# Patient Record
Sex: Male | Born: 1970
Health system: Southern US, Community
[De-identification: ages and names within clinical notes are randomized; demographics above are authoritative.]

## PROBLEM LIST (undated history)

## (undated) DIAGNOSIS — C4490 Unspecified malignant neoplasm of skin, unspecified: Secondary | ICD-10-CM

## (undated) DIAGNOSIS — Z808 Family history of malignant neoplasm of other organs or systems: Secondary | ICD-10-CM

## (undated) DIAGNOSIS — R51 Headache: Secondary | ICD-10-CM

## (undated) DIAGNOSIS — C801 Malignant (primary) neoplasm, unspecified: Secondary | ICD-10-CM

## (undated) DIAGNOSIS — Z85850 Personal history of malignant neoplasm of thyroid: Secondary | ICD-10-CM

## (undated) DIAGNOSIS — C73 Malignant neoplasm of thyroid gland: Secondary | ICD-10-CM

## (undated) DIAGNOSIS — Z9889 Other specified postprocedural states: Secondary | ICD-10-CM

## (undated) DIAGNOSIS — R112 Nausea with vomiting, unspecified: Secondary | ICD-10-CM

## (undated) HISTORY — DX: Unspecified malignant neoplasm of skin, unspecified: C44.90

## (undated) HISTORY — DX: Family history of malignant neoplasm of other organs or systems: Z80.8

## (undated) HISTORY — DX: Malignant (primary) neoplasm, unspecified: C80.1

## (undated) HISTORY — DX: Personal history of malignant neoplasm of thyroid: Z85.850

## (undated) HISTORY — PX: TYMPANOSTOMY TUBE PLACEMENT: SHX32

## (undated) HISTORY — PX: APPENDECTOMY: SHX54

---

## 2009-02-21 ENCOUNTER — Ambulatory Visit: Payer: Self-pay | Admitting: Family Medicine

## 2009-02-21 DIAGNOSIS — L989 Disorder of the skin and subcutaneous tissue, unspecified: Secondary | ICD-10-CM | POA: Insufficient documentation

## 2009-02-21 DIAGNOSIS — C73 Malignant neoplasm of thyroid gland: Secondary | ICD-10-CM | POA: Insufficient documentation

## 2009-02-21 DIAGNOSIS — D179 Benign lipomatous neoplasm, unspecified: Secondary | ICD-10-CM | POA: Insufficient documentation

## 2009-02-24 ENCOUNTER — Encounter (INDEPENDENT_AMBULATORY_CARE_PROVIDER_SITE_OTHER): Payer: Self-pay | Admitting: *Deleted

## 2009-02-24 LAB — CONVERTED CEMR LAB: Free T4: 0.8 ng/dL (ref 0.6–1.6)

## 2009-04-03 ENCOUNTER — Encounter: Payer: Self-pay | Admitting: Family Medicine

## 2009-04-07 ENCOUNTER — Encounter: Admission: RE | Admit: 2009-04-07 | Discharge: 2009-04-07 | Payer: Self-pay | Admitting: Surgery

## 2010-05-14 ENCOUNTER — Ambulatory Visit: Payer: Self-pay | Admitting: Family Medicine

## 2010-05-14 DIAGNOSIS — B356 Tinea cruris: Secondary | ICD-10-CM

## 2010-05-15 LAB — CONVERTED CEMR LAB
ALT: 19 units/L (ref 0–53)
Albumin: 4.2 g/dL (ref 3.5–5.2)
Alkaline Phosphatase: 44 units/L (ref 39–117)
BUN: 17 mg/dL (ref 6–23)
Basophils Absolute: 0 10*3/uL (ref 0.0–0.1)
Basophils Relative: 0.5 % (ref 0.0–3.0)
Calcium: 9.4 mg/dL (ref 8.4–10.5)
Eosinophils Absolute: 0 10*3/uL (ref 0.0–0.7)
Glucose, Bld: 82 mg/dL (ref 70–99)
Lymphs Abs: 1.2 10*3/uL (ref 0.7–4.0)
MCV: 97.5 fL (ref 78.0–100.0)
Monocytes Absolute: 0.3 10*3/uL (ref 0.1–1.0)
Monocytes Relative: 7.2 % (ref 3.0–12.0)
Neutro Abs: 2.4 10*3/uL (ref 1.4–7.7)
Neutrophils Relative %: 60.2 % (ref 43.0–77.0)
Platelets: 155 10*3/uL (ref 150.0–400.0)
RDW: 12.2 % (ref 11.5–14.6)
Total Bilirubin: 1.3 mg/dL — ABNORMAL HIGH (ref 0.3–1.2)
Total CHOL/HDL Ratio: 3
VLDL: 4 mg/dL (ref 0.0–40.0)

## 2010-05-27 ENCOUNTER — Encounter: Admission: RE | Admit: 2010-05-27 | Discharge: 2010-05-27 | Payer: Self-pay | Admitting: Family Medicine

## 2010-05-28 ENCOUNTER — Telehealth (INDEPENDENT_AMBULATORY_CARE_PROVIDER_SITE_OTHER): Payer: Self-pay | Admitting: *Deleted

## 2011-01-05 NOTE — Assessment & Plan Note (Signed)
Summary: cpx//pt will be fasting//lch   Vital Signs:  Patient profile:   40 year old male Height:      76.25 inches Weight:      223 pounds BMI:     27.06 Pulse rate:   50 / minute BP sitting:   110 / 70  (left arm)  Vitals Entered By: Doristine Devoid (May 14, 2010 8:32 AM) CC: CPX AND LABS   History of Present Illness: 40 yo man here today for CPE.  moving back to [place] in July.  itching in the groin- sxs started  ~2 yrs ago.  intermittant.  worse w/ heat.  has tried OTC hydrocortisone which improves sxs but doesn't resolve them.  bilateral groin sxs and lower back.  no family members w/ similar.  Thyroid nodule- was due for f/u  ~9 months ago but did not go back.  Preventive Screening-Counseling & Management  Alcohol-Tobacco     Alcohol drinks/day: <1     Alcohol type: beer     Smoking Status: never  Caffeine-Diet-Exercise     Does Patient Exercise: yes     Type of exercise: running, gym for lifting      Sexual History:  currently monogamous.        Drug Use:  never.    Current Medications (verified): 1)  None  Allergies (verified): No Known Drug Allergies  Past History:  Past Medical History: Last updated: 02/21/2009 Thyroid Nodule-right  Past Surgical History: Last updated: 02/21/2009 Appendectomy  Family History: Last updated: 02/21/2009 CAD-no HTN-no DM-no STROKE-no COLON CA-no PROSTATE CA-no Skin cancer- paternal aunt  Social History: Last updated: 02/21/2009 married, originally from [place] works for [employer] 2 children no tobacco 2-3 beers/week no drugs  Review of Systems  The patient denies anorexia, fever, weight loss, weight gain, vision loss, decreased hearing, hoarseness, chest pain, syncope, dyspnea on exertion, peripheral edema, prolonged cough, headaches, abdominal pain, melena, hematochezia, severe indigestion/heartburn, hematuria, suspicious skin lesions, depression, abnormal bleeding, enlarged lymph nodes, and testicular  masses.    Physical Exam  General:  Well-developed,well-nourished,in no acute distress; alert,appropriate and cooperative throughout examination Head:  Normocephalic and atraumatic without obvious abnormalities. No apparent alopecia or balding. Eyes:  PERRL, EOMI, eye exam yesterday w/ ophtho Ears:  External ear exam shows no significant lesions or deformities.  Otoscopic examination reveals clear canals, tympanic membranes are intact bilaterally without bulging, retraction, inflammation or discharge. Hearing is grossly normal bilaterally. Nose:  External nasal examination shows no deformity or inflammation. Nasal mucosa are pink and moist without lesions or exudates. Mouth:  Oral mucosa and oropharynx without lesions or exudates.  Teeth in good repair. Neck:  No deformities, ? small thyroid nodule on R, no tenderness noted. Lungs:  Normal respiratory effort, chest expands symmetrically. Lungs are clear to auscultation, no crackles or wheezes. Heart:  Normal rate and regular rhythm. S1 and S2 normal without gallop, murmur, click, rub or other extra sounds. Abdomen:  soft, NT/ND, +BS Genitalia:  Testes bilaterally descended without nodularity, tenderness or masses. No scrotal masses or lesions. No penis lesions or urethral discharge. Msk:  No deformity or scoliosis noted of thoracic or lumbar spine.   Pulses:  + 2 carotid, radial, DP Extremities:  No clubbing, cyanosis, edema, or deformity noted with normal full range of motion of all joints.   Neurologic:  alert & oriented X3, cranial nerves II-XII intact, gait normal, and DTRs symmetrical and normal.   Skin:  tinea cruris bilaterally Cervical Nodes:  No lymphadenopathy noted  Inguinal Nodes:  No significant adenopathy Psych:  Cognition and judgment appear intact. Alert and cooperative with normal attention span and concentration. No apparent delusions, illusions, hallucinations   Impression & Recommendations:  Problem # 1:  PHYSICAL  EXAMINATION (ICD-V70.0) Assessment New pt's PE WNL.  check labs.  anticipatory guidance provided. Orders: Venipuncture (04540) TLB-Lipid Panel (80061-LIPID) TLB-BMP (Basic Metabolic Panel-BMET) (80048-METABOL) TLB-CBC Platelet - w/Differential (85025-CBCD) TLB-Hepatic/Liver Function Pnl (80076-HEPATIC) TLB-TSH (Thyroid Stimulating Hormone) (84443-TSH)  Problem # 2:  THYROID NODULE, RIGHT (ICD-241.0) Assessment: Unchanged due for f/u US, referral made. Orders: Radiology Referral (Radiology)  Problem # 3:  TINEA CRURIS (ICD-110.3) Assessment: New start Lamisil.  no signs of superimposed infxn.  Patient Instructions: 1)  We'll notify you of your lab results 2)  Someone will call you with your ultrasound appt 3)  Use Lamisil two times a day on affected areas- try and keep area clean and dry 4)  Keep an eye on the small, darker mole in the center of your lower back, right above your beltline- it looks fine now, but watch for changes 5)  Good luck with the move!

## 2011-01-05 NOTE — Progress Notes (Signed)
Summary: ultrasound  Phone Note Outgoing Call   Call placed by: Doristine Devoid,  May 28, 2010 9:46 AM Call placed to: Patient Summary of Call: pt has small new nodule and previous nodule has enlarged slightly.  please notify pt that he should have this followed up after they move.  we can print copy of report for pt to take with him  Follow-up for Phone Call        left message on machine .....Marland KitchenMarland KitchenDoristine Devoid  May 28, 2010 9:46 AM   spoke w/ patient aware of ultrasound report and mailed copy of patient ........Marland KitchenDoristine Devoid  May 28, 2010 11:04 AM

## 2011-08-06 ENCOUNTER — Telehealth: Payer: Self-pay | Admitting: Family Medicine

## 2011-08-06 NOTE — Telephone Encounter (Signed)
Left detailed msg for pt and wife no immunizations on file.

## 2011-10-15 ENCOUNTER — Telehealth: Payer: Self-pay | Admitting: Family Medicine

## 2011-10-15 NOTE — Telephone Encounter (Signed)
Pt was referred on 02-21-09 to Dr Gerrit Friends at [facility] for evaluation and treatment. Pt was last seen on 05-14-10 for CPX and advised that he was moving back to [place] in July.. Please advise

## 2011-10-15 NOTE — Telephone Encounter (Signed)
Pt aware Dr Gerrit Friends [phone] info given. Pt indicated that he is in the process of apply for green cards so he has to see a special physician so that is why he has not been in.

## 2011-10-15 NOTE — Telephone Encounter (Signed)
If he has seen Dr Gerrit Friends previously he can call and schedule an appt (please give him the #).  If he is back in town he will need to schedule an appt.

## 2011-10-15 NOTE — Telephone Encounter (Signed)
Noted. Thanks.

## 2011-12-08 ENCOUNTER — Other Ambulatory Visit (INDEPENDENT_AMBULATORY_CARE_PROVIDER_SITE_OTHER): Payer: Self-pay | Admitting: Surgery

## 2011-12-08 DIAGNOSIS — E041 Nontoxic single thyroid nodule: Secondary | ICD-10-CM

## 2011-12-08 DIAGNOSIS — E049 Nontoxic goiter, unspecified: Secondary | ICD-10-CM

## 2011-12-10 ENCOUNTER — Ambulatory Visit
Admission: RE | Admit: 2011-12-10 | Discharge: 2011-12-10 | Disposition: A | Discharge status: Home / Self Care | Payer: BC Managed Care – PPO | Source: Ambulatory Visit | Attending: Surgery | Admitting: Surgery

## 2011-12-10 ENCOUNTER — Other Ambulatory Visit (INDEPENDENT_AMBULATORY_CARE_PROVIDER_SITE_OTHER): Payer: Self-pay | Admitting: Surgery

## 2011-12-10 DIAGNOSIS — E041 Nontoxic single thyroid nodule: Secondary | ICD-10-CM

## 2011-12-11 LAB — TSH: TSH: 1.631 u[IU]/mL (ref 0.350–4.500)

## 2011-12-14 ENCOUNTER — Ambulatory Visit (INDEPENDENT_AMBULATORY_CARE_PROVIDER_SITE_OTHER): Payer: BC Managed Care – PPO | Admitting: Surgery

## 2011-12-14 ENCOUNTER — Encounter (INDEPENDENT_AMBULATORY_CARE_PROVIDER_SITE_OTHER): Payer: Self-pay | Admitting: Surgery

## 2011-12-14 VITALS — BP 118/84 | HR 56 | Temp 99.0°F | Resp 16 | Ht 76.0 in | Wt 227.8 lb

## 2011-12-14 DIAGNOSIS — E041 Nontoxic single thyroid nodule: Secondary | ICD-10-CM

## 2011-12-14 NOTE — Progress Notes (Signed)
Visit Diagnoses: 1. THYROID NODULE, RIGHT    HISTORY: The patient is a 41 year old white male with known history of right thyroid nodule. Patient has undergone sequential ultrasound scanning since 2010. Nodule has remained stable. Thyroid function tests have been normal.  At my request he had a followup thyroid ultrasound performed last week. This shows the right lobe measuring 5.2 cm and left lobe measuring 4.6 cm. There is a tiny 5 mm hypoechoic nodule in the superior pole of the right lobe. This is stable. There is a dominant 1.5 cm nodule which appears well circumscribed and has no evidence of calcification in the mid right lobe. Left lobe is normal.  Laboratory studies show a normal TSH level of 1.631. Patient has never been on thyroid medication.  PERTINENT REVIEW OF SYSTEMS: Denies tremor. Denies palpitations. Denies palpable mass. Denies dysphagia. Denies dyspnea.  EXAM: HEENT: normocephalic; pupils equal and reactive; sclerae clear; dentition good; mucous membranes moist NECK:  Palpable nodule near right thyroid lobe, 1.5 cm, nontender, soft, mobile; symmetric on extension; no palpable anterior or posterior cervical lymphadenopathy; no supraclavicular masses; no tenderness CHEST: clear to auscultation bilaterally without rales, rhonchi, or wheezes CARDIAC: regular rate and rhythm without significant murmur; peripheral pulses are full EXT:  non-tender without edema; no deformity NEURO: no gross focal deficits; no sign of tremor   IMPRESSION: Right thyroid nodule, 1.5 cm, clinically stable  PLAN: The patient and I reviewed all of the above results. Clinically the right thyroid nodule is stable. There are no worrisome findings. I believe this is a low risk lesion. We will plan to repeat a thyroid ultrasound in 2 years. We will check a TSH level at that time. He will return for physical examination following the ultrasound examination.  Velora Heckler, MD, FACS General & Endocrine  [facility]

## 2012-01-12 ENCOUNTER — Ambulatory Visit (INDEPENDENT_AMBULATORY_CARE_PROVIDER_SITE_OTHER): Payer: BC Managed Care – PPO | Admitting: Family Medicine

## 2012-01-12 ENCOUNTER — Encounter: Payer: Self-pay | Admitting: Family Medicine

## 2012-01-12 VITALS — BP 115/80 | HR 58 | Temp 98.0°F | Ht 76.0 in | Wt 225.5 lb

## 2012-01-12 DIAGNOSIS — J3081 Allergic rhinitis due to animal (cat) (dog) hair and dander: Secondary | ICD-10-CM

## 2012-01-12 NOTE — Progress Notes (Signed)
  Subjective:    Patient ID: Jose Mills, male    DOB: 1971/05/28, 41 y.o.   MRN: [id number]  HPI Recently returned from [place].  ? Allergies- hx of cat allergy, wants to be sure he is not allergic to dogs b/c family is considering getting dog.  Grew up w/ dogs and did not have problem but feels allergies are progressing.   Review of Systems For ROS see HPI     Objective:   Physical Exam  Vitals reviewed. Constitutional: He appears well-developed and well-nourished. No distress.  Skin: Skin is warm and dry.          Assessment & Plan:

## 2012-01-12 NOTE — Patient Instructions (Signed)
We will notify you of your lab results and whether you are allergic to dogs Call with any questions or concerns Welcome back!!

## 2012-01-12 NOTE — Assessment & Plan Note (Signed)
Pt is allergic to cats.  Will order blood work to test for possible dog allergy.  Pt expressed understanding and is in agreement w/ plan.

## 2012-01-14 LAB — ~~LOC~~ ALLERGY PANEL
Allergen, Cedar tree, t12: <0.1 kU/L (ref <0.35)
Allergen, Comm Silver Birch, t9: <0.1 kU/L (ref <0.35)
Alternaria Alternata: <0.1 kU/L (ref <0.35)
Aspergillus fumigatus, IgG: <0.1 kU/L (ref <0.35)
Bahia Grass: <0.1 kU/L (ref <0.35)
Bermuda Grass: <0.1 kU/L (ref <0.35)
Elm IgE: <0.1 kU/L (ref <0.35)
Johnson Grass: <0.1 kU/L (ref <0.35)
Mugwort: <0.1 kU/L (ref <0.35)
Nettle: <0.1 kU/L (ref <0.35)
Timothy Grass: <0.1 kU/L (ref <0.35)

## 2012-05-19 ENCOUNTER — Encounter: Payer: Self-pay | Admitting: Family Medicine

## 2012-05-19 ENCOUNTER — Ambulatory Visit (INDEPENDENT_AMBULATORY_CARE_PROVIDER_SITE_OTHER): Payer: BC Managed Care – PPO | Admitting: Family Medicine

## 2012-05-19 VITALS — BP 120/82 | HR 62 | Temp 98.2°F | Resp 16 | Ht 76.0 in | Wt 217.6 lb

## 2012-05-19 DIAGNOSIS — L0291 Cutaneous abscess, unspecified: Secondary | ICD-10-CM

## 2012-05-19 DIAGNOSIS — L0390 Cellulitis, unspecified: Secondary | ICD-10-CM | POA: Insufficient documentation

## 2012-05-19 DIAGNOSIS — L255 Unspecified contact dermatitis due to plants, except food: Secondary | ICD-10-CM

## 2012-05-19 MED ORDER — PREDNISONE 20 MG PO TABS: ORAL_TABLET | ORAL | Status: DC

## 2012-05-19 MED ORDER — CEPHALEXIN 500 MG PO CAPS: 500.0000 mg | ORAL_CAPSULE | Freq: Two times a day (BID) | ORAL | Status: AC

## 2012-05-19 NOTE — Progress Notes (Signed)
  Subjective:    Patient ID: Jose Mills, male    DOB: [birth year], 41 y.o.   MRN: [id number]  HPI Rash- sxs started 7-8 days ago, thought it was a mosquito bite.  Areas continued to spread.  Linear on ankles, L calf, bilateral forearms, L lower thigh.  + itchy, oozing.  Now warm and red.  No fevers.   Review of Systems For ROS see HPI     Objective:   Physical Exam  Vitals reviewed. Constitutional: He appears well-developed and well-nourished. No distress.  Skin: Skin is warm. Rash (linear, vesicular rash of both ankles, L calf, L lower thigh, forearms bilaterally.  ankle lesions w/ yellow crusting and drainage w/ surrounding erythema and warmth) noted. There is erythema.          Assessment & Plan:

## 2012-05-19 NOTE — Patient Instructions (Addendum)
This is infected poison ivy Start the Keflex twice daily for the infection If the redness continues to spread or the drainage worsens- call us! Pick up the prednisone if you need it for the itching Call with any questions or concerns Have a good weekend!

## 2012-05-23 NOTE — Assessment & Plan Note (Signed)
New.  Pt w/ poison ivy and now w/ infxn.  Start prednisone for itching and keflex for infxn.  Pt expressed understanding and is in agreement w/ plan.

## 2012-05-23 NOTE — Assessment & Plan Note (Signed)
New.  Pt w/ likely superimposed impetigo infxn at site of original poison ivy.  Start keflex.  Pt expressed understanding and is in agreement w/ plan.

## 2013-10-09 ENCOUNTER — Encounter (INDEPENDENT_AMBULATORY_CARE_PROVIDER_SITE_OTHER): Payer: Self-pay | Admitting: Surgery

## 2013-12-06 DIAGNOSIS — C73 Malignant neoplasm of thyroid gland: Secondary | ICD-10-CM

## 2013-12-06 HISTORY — DX: Malignant neoplasm of thyroid gland: C73

## 2013-12-17 ENCOUNTER — Telehealth (INDEPENDENT_AMBULATORY_CARE_PROVIDER_SITE_OTHER): Payer: Self-pay

## 2013-12-17 DIAGNOSIS — E042 Nontoxic multinodular goiter: Secondary | ICD-10-CM

## 2013-12-17 NOTE — Telephone Encounter (Signed)
Pt is scheduled for his thyroid ultrasound on 12/21/13 with an arrival time of 1:30pm at [id number].  Please make pt aware when he calls back.

## 2013-12-17 NOTE — Telephone Encounter (Signed)
Pt has appt for ltf 12-28-13. Pt needs thyroid ultrasound and TSH prior to appt. Orders are in epic. LMOM for pt to call. Lab slip mailed to pt address and pt can call [phone] to set up thyroid ultrasound.

## 2013-12-19 NOTE — Telephone Encounter (Signed)
I called pt again to inform him of his appt, he did state that he could not make the appt this Friday.  I provided him with GI phone number and he will call to reschedule.  I made him aware to have US done prior to his appt next Friday with Dr. Harlow Asa.  Also informed him to have his labs drawn prior to his appt on Friday.  He is agreeable with all information given at this time.

## 2013-12-21 ENCOUNTER — Other Ambulatory Visit: Payer: BC Managed Care – PPO

## 2013-12-24 ENCOUNTER — Ambulatory Visit
Admission: RE | Admit: 2013-12-24 | Discharge: 2013-12-24 | Disposition: A | Discharge status: Home / Self Care | Payer: BC Managed Care – PPO | Source: Ambulatory Visit | Attending: Surgery | Admitting: Surgery

## 2013-12-24 DIAGNOSIS — E042 Nontoxic multinodular goiter: Secondary | ICD-10-CM

## 2013-12-24 LAB — TSH: TSH: 2.089 u[IU]/mL (ref 0.350–4.500)

## 2013-12-28 ENCOUNTER — Ambulatory Visit (INDEPENDENT_AMBULATORY_CARE_PROVIDER_SITE_OTHER): Payer: BC Managed Care – PPO | Admitting: Surgery

## 2013-12-28 ENCOUNTER — Encounter (INDEPENDENT_AMBULATORY_CARE_PROVIDER_SITE_OTHER): Payer: Self-pay | Admitting: Surgery

## 2013-12-28 VITALS — BP 122/80 | HR 70 | Temp 98.0°F | Resp 18 | Ht 76.0 in | Wt 231.6 lb

## 2013-12-28 DIAGNOSIS — E041 Nontoxic single thyroid nodule: Secondary | ICD-10-CM

## 2013-12-28 NOTE — Progress Notes (Signed)
General Surgery [facility]  Chief Complaint  Patient presents with  . Follow-up    thyroid nodules    HISTORY: Patient is a 43 year old male followed for thyroid nodule. It has been 2 years since his last evaluation. At my request she underwent a thyroid ultrasound on 12/24/2013. This shows a normal sized thyroid gland. The dominant nodule in the right lobe at the inferior pole has increased in size in the interval to 17 x 10 x 14 mm. No other worrisome features are identified. Left lobe shows no nodules. There is no lymphadenopathy.  TSH level remains normal at 2.089. The patient has never been on thyroid medication.  PERTINENT REVIEW OF SYSTEMS: Denies tremor. Denies palpitations. Denies discomfort. Denies compressive symptoms. Denies new masses.  EXAM: HEENT: normocephalic; pupils equal and reactive; sclerae clear; dentition good; mucous membranes moist NECK:  Palpable smooth firm nodule inferior right thyroid lobe, approximately 1.5 cm in diameter; left thyroid lobe without palpable abnormality; symmetric on extension; no palpable anterior or posterior cervical lymphadenopathy; no supraclavicular masses; no tenderness CHEST: clear to auscultation bilaterally without rales, rhonchi, or wheezes CARDIAC: regular rate and rhythm without significant murmur; peripheral pulses are full EXT:  non-tender without edema; no deformity NEURO: no gross focal deficits; no sign of tremor   IMPRESSION: Right thyroid nodule, 1.7 cm, with interval increase in size  PLAN: The patient and I reviewed his ultrasound report and his laboratory studies in detail. I have recommended ultrasound-guided fine-needle aspiration biopsy of the dominant right thyroid nodule. We discussed potential results from his aspiration and cytopathology. He understands and agrees to proceed.  I will contact the patient with his cytopathology results when they are available.  Earnstine Regal, MD,  [facility] Office: [phone]  Visit Diagnoses: 1. THYROID NODULE, RIGHT

## 2013-12-28 NOTE — Patient Instructions (Signed)
Thyroid Biopsy °The thyroid gland is a butterfly-shaped gland situated in the front of the neck. It produces hormones which affect metabolism, growth and development, and body temperature. A thyroid biopsy is a procedure in which small samples of tissue or fluid are removed from the thyroid gland or mass and examined under a microscope. This test is done to determine the cause of thyroid problems, such as infection, cancer, or other thyroid problems. °There are 2 ways to obtain samples: °1. Fine needle biopsy. Samples are removed using a thin needle inserted through the skin and into the thyroid gland or mass. °2. Open biopsy. Samples are removed after a cut (incision) is made through the skin. °LET YOUR CAREGIVER KNOW ABOUT:  °· Allergies. °· Medications taken including herbs, eye drops, over-the-counter medications, and creams. °· Use of steroids (by mouth or creams). °· Previous problems with anesthetics or numbing medicine. °· Possibility of pregnancy, if this applies. °· History of blood clots (thrombophlebitis). °· History of bleeding or blood problems. °· Previous surgery. °· Other health problems. °RISKS AND COMPLICATIONS °· Bleeding from the site. The risk of bleeding is higher if you have a bleeding disorder or are taking any blood thinning medications (anticoagulants). °· Infection. °· Injury to structures near the thyroid gland. °BEFORE THE PROCEDURE  °This is a procedure that can be done as an outpatient. Confirm the time that you need to arrive for your procedure. Confirm whether there is a need to fast or withhold any medications. A blood sample may be done to determine your blood clotting time. Medicine may be given to help you relax (sedative). °PROCEDURE °Fine needle biopsy. °You will be awake during the procedure. You may be asked to lie on your back with your head tipped backward to extend your neck. Let your caregiver know if you cannot tolerate the positioning. An area on your neck will be  cleansed. A needle is inserted through the skin of your neck. You may feel a mild discomfort during this procedure. You may be asked to avoid coughing, talking, swallowing, or making sounds during some portions of the procedure. The needle is withdrawn once tissue or fluid samples have been removed. Pressure may be applied to the neck to reduce swelling and ensure that bleeding has stopped. The samples will be sent for examination.  °Open biopsy. °You will be given general anesthesia. You will be asleep during the procedure. An incision is made in your neck. A sample of thyroid tissue or the mass is removed. The tissue sample or mass will be sent for examination. The sample or mass may be examined during the biopsy. If the sample or mass contains cancer cells, some or all of the thyroid gland may be removed. The incision is closed with stitches. °AFTER THE PROCEDURE  °Your recovery will be assessed and monitored. If there are no problems, as an outpatient, you should be able to go home shortly after the procedure. °If you had a fine needle biopsy: °· You may have soreness at the biopsy site for 1 to 2 days. °If you had an open biopsy:  °· You may have soreness at the biopsy site for 3 to 4 days. °· You may have a hoarse voice or sore throat for 1 to 2 days. °Obtaining the Test Results °It is your responsibility to obtain your test results. Do not assume everything is normal if you have not heard from your caregiver or the medical facility. It is important for you to follow up   on all of your test results. °HOME CARE INSTRUCTIONS  °· Keeping your head raised on a pillow when you are lying down may ease biopsy site discomfort. °· Supporting the back of your head and neck with both hands as you sit up from a lying position may ease biopsy site discomfort. °· Only take over-the-counter or prescription medicines for pain, discomfort, or fever as directed by your caregiver. °· Throat lozenges or gargling with warm salt  water may help to soothe a sore throat. °SEEK IMMEDIATE MEDICAL CARE IF:  °· You have severe bleeding from the biopsy site. °· You have difficulty swallowing. °· You have a fever. °· You have increased pain, swelling, redness, or warmth at the biopsy site. °· You notice pus coming from the biopsy site. °· You have swollen glands (lymph nodes) in your neck. °Document Released: 09/19/2007 Document Revised: 03/19/2013 Document Reviewed: 02/19/2009 °ExitCare® Patient Information ©2014 [employer]. ° °

## 2014-01-03 ENCOUNTER — Ambulatory Visit
Admission: RE | Admit: 2014-01-03 | Discharge: 2014-01-03 | Disposition: A | Discharge status: Home / Self Care | Payer: BC Managed Care – PPO | Source: Ambulatory Visit | Attending: Surgery | Admitting: Surgery

## 2014-01-03 ENCOUNTER — Other Ambulatory Visit (HOSPITAL_COMMUNITY)
Admission: RE | Admit: 2014-01-03 | Discharge: 2014-01-03 | Disposition: A | Discharge status: Home / Self Care | Payer: BC Managed Care – PPO | Source: Ambulatory Visit | Attending: Interventional Radiology | Admitting: Interventional Radiology

## 2014-01-03 DIAGNOSIS — E041 Nontoxic single thyroid nodule: Secondary | ICD-10-CM

## 2014-01-03 DIAGNOSIS — E049 Nontoxic goiter, unspecified: Secondary | ICD-10-CM | POA: Insufficient documentation

## 2014-01-07 ENCOUNTER — Telehealth (INDEPENDENT_AMBULATORY_CARE_PROVIDER_SITE_OTHER): Payer: Self-pay

## 2014-01-07 NOTE — Telephone Encounter (Signed)
Message copied by Dois Davenport on Mon Jan 07, 2014  4:36 PM ------      Message from: Humphrey Rolls K      Created: Mon Jan 07, 2014  4:13 PM      Regarding: Dr Harlow Asa      Contact: [phone]       Wants bx results from 01/03/14 Raynelle Dick) ------

## 2014-01-07 NOTE — Telephone Encounter (Signed)
Msg forwarded to Dr Harlow Asa to review and call pt with result because of report.

## 2014-01-08 ENCOUNTER — Telehealth (INDEPENDENT_AMBULATORY_CARE_PROVIDER_SITE_OTHER): Payer: Self-pay | Admitting: Surgery

## 2014-01-08 ENCOUNTER — Other Ambulatory Visit (INDEPENDENT_AMBULATORY_CARE_PROVIDER_SITE_OTHER): Payer: Self-pay | Admitting: Surgery

## 2014-01-08 DIAGNOSIS — E041 Nontoxic single thyroid nodule: Secondary | ICD-10-CM

## 2014-01-08 NOTE — Telephone Encounter (Signed)
Telephone call to patient to discuss biopsy results - suspicious for papillary thyroid carcinoma.  Recommended right thyroid lobectomy with frozen section, possible total thyroidectomy if malignant.  Discussed procedure, risks including injury to RLN and parathyroids, hospital stay, and recovery.  The risks and benefits of the procedure have been discussed at length with the patient.  The patient understands the proposed procedure, potential alternative treatments, and the course of recovery to be expected.  All of the patient's questions have been answered at this time.  The patient wishes to proceed with surgery.  Will schedule at [facility] as soon as time available.  Earnstine Regal, MD, [facility] Office: [phone]

## 2014-01-10 ENCOUNTER — Encounter (HOSPITAL_COMMUNITY): Payer: Self-pay | Admitting: Pharmacy Technician

## 2014-01-11 ENCOUNTER — Other Ambulatory Visit (HOSPITAL_COMMUNITY): Payer: Self-pay | Admitting: *Deleted

## 2014-01-11 NOTE — Patient Instructions (Addendum)
Rafan Sanders Gerald  01/11/2014                            YOUR PROCEDURE IS SCHEDULED ON: 01/17/14               PLEASE REPORT TO SHORT STAY CENTER AT : 5:30 AM               CALL THIS NUMBER IF ANY PROBLEMS THE DAY OF SURGERY :               [phone]                                REMEMBER:   Do not eat food or drink liquids AFTER MIDNIGHT        Take these medicines the morning of surgery with A SIP OF WATER: NONE   Do not wear jewelry, make-up   Do not wear lotions, powders, or perfumes.   Do not shave legs or underarms 12 hrs. before surgery (men may shave face)  Do not bring valuables to the hospital.  Contacts, dentures or bridgework may not be worn into surgery.  Leave suitcase in the car. After surgery it may be brought to your room.  For patients admitted to the hospital more than one night, checkout time is 11:00 AM                                                       The day of discharge.   Patients discharged the day of surgery will not be allowed to drive home.              If going home same day of surgery, must have someone stay with you first              24 hrs at home and arrange for some one to drive you home from hospital.    Special Instructions:   Please read over the following fact sheets that you were given:               1. [place]                2. DISCONTINUE ASPIRIN AND HERBAL MED 5 DAYS PREOP                                                X_____________________________________________________________________        Failure to follow these instructions may result in cancellation of your surgery

## 2014-01-14 ENCOUNTER — Telehealth (INDEPENDENT_AMBULATORY_CARE_PROVIDER_SITE_OTHER): Payer: Self-pay | Admitting: General Surgery

## 2014-01-14 ENCOUNTER — Encounter (HOSPITAL_COMMUNITY)
Admission: RE | Admit: 2014-01-14 | Discharge: 2014-01-14 | Disposition: A | Discharge status: Home / Self Care | Payer: BC Managed Care – PPO | Source: Ambulatory Visit | Attending: Surgery | Admitting: Surgery

## 2014-01-14 ENCOUNTER — Encounter (HOSPITAL_COMMUNITY): Payer: Self-pay

## 2014-01-14 ENCOUNTER — Ambulatory Visit (HOSPITAL_COMMUNITY)
Admission: RE | Admit: 2014-01-14 | Discharge: 2014-01-14 | Disposition: A | Discharge status: Home / Self Care | Payer: BC Managed Care – PPO | Source: Ambulatory Visit | Attending: Surgery | Admitting: Surgery

## 2014-01-14 DIAGNOSIS — Z01818 Encounter for other preprocedural examination: Secondary | ICD-10-CM | POA: Insufficient documentation

## 2014-01-14 DIAGNOSIS — Z01812 Encounter for preprocedural laboratory examination: Secondary | ICD-10-CM | POA: Insufficient documentation

## 2014-01-14 HISTORY — DX: Headache: R51

## 2014-01-14 HISTORY — DX: Other specified postprocedural states: R11.2

## 2014-01-14 HISTORY — DX: Malignant neoplasm of thyroid gland: C73

## 2014-01-14 HISTORY — DX: Nausea with vomiting, unspecified: Z98.890

## 2014-01-14 HISTORY — DX: Other specified postprocedural states: Z98.890

## 2014-01-14 LAB — BASIC METABOLIC PANEL
BUN: 12 mg/dL (ref 6–23)
CHLORIDE: 105 meq/L (ref 96–112)
CO2: 28 mEq/L (ref 19–32)
Calcium: 9.3 mg/dL (ref 8.4–10.5)
Creatinine, Ser: 0.99 mg/dL (ref 0.50–1.35)
GFR calc Af Amer: >90 mL/min (ref >90)
GLUCOSE: 89 mg/dL (ref 70–99)
Potassium: 4.5 mEq/L (ref 3.7–5.3)
Sodium: 143 mEq/L (ref 137–147)

## 2014-01-14 LAB — CBC
HCT: 45.9 % (ref 39.0–52.0)
Hemoglobin: 15.9 g/dL (ref 13.0–17.0)
MCH: 33.2 pg (ref 26.0–34.0)
MCHC: 34.6 g/dL (ref 30.0–36.0)
MCV: 95.8 fL (ref 78.0–100.0)
Platelets: 169 10*3/uL (ref 150–400)
RBC: 4.79 MIL/uL (ref 4.22–5.81)
RDW: 11.9 % (ref 11.5–15.5)
WBC: 4.1 10*3/uL (ref 4.0–10.5)

## 2014-01-14 NOTE — Progress Notes (Signed)
Quick Note:  These results are acceptable for scheduled surgery.   M. , MD, FACS [facility] Office: [phone]   ______ 

## 2014-01-14 NOTE — Telephone Encounter (Signed)
Pt's wife called with several questions about after the surgery on Thursday.  Pt plans to drive himself to the hospital that day, as wife has young children at home.  She wants to know if someone can call her and let her know he is out of surgery and waking up.  Explained that her husband needs to pass on that information when he get checked in.  She also asked about appropriate food choices for when he gets home after surgery.  Questions answered.

## 2014-01-16 NOTE — Anesthesia Preprocedure Evaluation (Addendum)
Anesthesia Evaluation  Patient identified by MRN, date of birth, ID band Patient awake    Reviewed: Allergy & Precautions, H&P , NPO status , Patient's Chart, lab work & pertinent test results  History of Anesthesia Complications (+) PONV  Airway Mallampati: II TM Distance: >3 FB Neck ROM: Full    Dental  (+) Teeth Intact, Dental Advisory Given   Pulmonary neg pulmonary ROS,  breath sounds clear to auscultation  Pulmonary exam normal       Cardiovascular negative cardio ROS  Rhythm:Regular Rate:Normal     Neuro/Psych  Headaches, negative neurological ROS  negative psych ROS   GI/Hepatic negative GI ROS, Neg liver ROS,   Endo/Other  negative endocrine ROS  Renal/GU negative Renal ROS  negative genitourinary   Musculoskeletal negative musculoskeletal ROS (+)   Abdominal   Peds  Hematology negative hematology ROS (+)   Anesthesia Other Findings   Reproductive/Obstetrics                          Anesthesia Physical Anesthesia Plan  ASA: II  Anesthesia Plan: General   Post-op Pain Management:    Induction: Intravenous  Airway Management Planned: Oral ETT  Additional Equipment:   Intra-op Plan:   Post-operative Plan: Extubation in OR  Informed Consent: I have reviewed the patients History and Physical, chart, labs and discussed the procedure including the risks, benefits and alternatives for the proposed anesthesia with the patient or authorized representative who has indicated his/her understanding and acceptance.   Dental advisory given  Plan Discussed with: CRNA  Anesthesia Plan Comments:         Anesthesia Quick Evaluation

## 2014-01-17 ENCOUNTER — Encounter (HOSPITAL_COMMUNITY)
Admission: RE | Disposition: A | Discharge status: Home / Self Care | Payer: Self-pay | Source: Ambulatory Visit | Attending: Surgery

## 2014-01-17 ENCOUNTER — Ambulatory Visit (HOSPITAL_COMMUNITY): Payer: BC Managed Care – PPO | Admitting: Anesthesiology

## 2014-01-17 ENCOUNTER — Encounter (HOSPITAL_COMMUNITY): Payer: BC Managed Care – PPO | Admitting: Anesthesiology

## 2014-01-17 ENCOUNTER — Encounter (HOSPITAL_COMMUNITY): Payer: Self-pay | Admitting: *Deleted

## 2014-01-17 ENCOUNTER — Observation Stay (HOSPITAL_COMMUNITY)
Admission: RE | Admit: 2014-01-17 | Discharge: 2014-01-18 | Disposition: A | Discharge status: Home / Self Care | Payer: BC Managed Care – PPO | Source: Ambulatory Visit | Attending: Surgery | Admitting: Surgery

## 2014-01-17 DIAGNOSIS — Z9089 Acquired absence of other organs: Secondary | ICD-10-CM | POA: Insufficient documentation

## 2014-01-17 DIAGNOSIS — C73 Malignant neoplasm of thyroid gland: Principal | ICD-10-CM | POA: Diagnosis present

## 2014-01-17 DIAGNOSIS — D440 Neoplasm of uncertain behavior of thyroid gland: Secondary | ICD-10-CM | POA: Diagnosis present

## 2014-01-17 DIAGNOSIS — G43909 Migraine, unspecified, not intractable, without status migrainosus: Secondary | ICD-10-CM | POA: Insufficient documentation

## 2014-01-17 HISTORY — PX: THYROID LOBECTOMY: SHX420

## 2014-01-17 SURGERY — LOBECTOMY, THYROID
Anesthesia: General | Site: Neck | Laterality: Right

## 2014-01-17 MED ORDER — LIDOCAINE HCL (CARDIAC) 20 MG/ML IV SOLN
INTRAVENOUS | Status: AC
  Filled 2014-01-17: qty 5

## 2014-01-17 MED ORDER — FENTANYL CITRATE 0.05 MG/ML IJ SOLN
INTRAMUSCULAR | Status: AC
  Filled 2014-01-17: qty 5

## 2014-01-17 MED ORDER — DEXAMETHASONE SODIUM PHOSPHATE 10 MG/ML IJ SOLN
INTRAMUSCULAR | Status: DC | PRN
  Administered 2014-01-17: 10 mg via INTRAVENOUS

## 2014-01-17 MED ORDER — HYDROMORPHONE HCL PF 1 MG/ML IJ SOLN: 1.0000 mg | INTRAMUSCULAR | Status: DC | PRN

## 2014-01-17 MED ORDER — MIDAZOLAM HCL 5 MG/5ML IJ SOLN
INTRAMUSCULAR | Status: DC | PRN
  Administered 2014-01-17: 2 mg via INTRAVENOUS

## 2014-01-17 MED ORDER — ROCURONIUM BROMIDE 100 MG/10ML IV SOLN
INTRAVENOUS | Status: AC
  Filled 2014-01-17: qty 1

## 2014-01-17 MED ORDER — MIDAZOLAM HCL 2 MG/2ML IJ SOLN
INTRAMUSCULAR | Status: AC
  Filled 2014-01-17: qty 2

## 2014-01-17 MED ORDER — ONDANSETRON HCL 4 MG/2ML IJ SOLN
INTRAMUSCULAR | Status: AC
  Filled 2014-01-17: qty 2

## 2014-01-17 MED ORDER — CEFAZOLIN SODIUM-DEXTROSE 2-3 GM-% IV SOLR
2.0000 g | INTRAVENOUS | Status: AC
  Administered 2014-01-17: 2 g via INTRAVENOUS

## 2014-01-17 MED ORDER — PROMETHAZINE HCL 25 MG/ML IJ SOLN: 6.2500 mg | INTRAMUSCULAR | Status: DC | PRN

## 2014-01-17 MED ORDER — KCL IN DEXTROSE-NACL 20-5-0.45 MEQ/L-%-% IV SOLN
INTRAVENOUS | Status: DC
  Administered 2014-01-17: 13:00:00 via INTRAVENOUS
  Filled 2014-01-17 (×2): qty 1000

## 2014-01-17 MED ORDER — PROPOFOL 10 MG/ML IV BOLUS
INTRAVENOUS | Status: DC | PRN
  Administered 2014-01-17: 200 mg via INTRAVENOUS

## 2014-01-17 MED ORDER — ONDANSETRON HCL 4 MG/2ML IJ SOLN: 4.0000 mg | Freq: Four times a day (QID) | INTRAMUSCULAR | Status: DC | PRN

## 2014-01-17 MED ORDER — CEFAZOLIN SODIUM-DEXTROSE 2-3 GM-% IV SOLR
INTRAVENOUS | Status: AC
  Filled 2014-01-17: qty 50

## 2014-01-17 MED ORDER — HYDROCODONE-ACETAMINOPHEN 5-325 MG PO TABS: 1.0000 | ORAL_TABLET | ORAL | Status: DC | PRN

## 2014-01-17 MED ORDER — 0.9 % SODIUM CHLORIDE (POUR BTL) OPTIME
TOPICAL | Status: DC | PRN
  Administered 2014-01-17: 1000 mL

## 2014-01-17 MED ORDER — LIDOCAINE HCL (CARDIAC) 20 MG/ML IV SOLN
INTRAVENOUS | Status: DC | PRN
  Administered 2014-01-17: 100 mg via INTRAVENOUS

## 2014-01-17 MED ORDER — GLYCOPYRROLATE 0.2 MG/ML IJ SOLN
INTRAMUSCULAR | Status: AC
Start: 2014-01-17 — End: 2014-01-17
  Filled 2014-01-17: qty 3

## 2014-01-17 MED ORDER — SUCCINYLCHOLINE CHLORIDE 20 MG/ML IJ SOLN
INTRAMUSCULAR | Status: DC | PRN
  Administered 2014-01-17: 100 mg via INTRAVENOUS

## 2014-01-17 MED ORDER — DEXAMETHASONE SODIUM PHOSPHATE 10 MG/ML IJ SOLN
INTRAMUSCULAR | Status: AC
  Filled 2014-01-17: qty 1

## 2014-01-17 MED ORDER — ONDANSETRON HCL 4 MG PO TABS: 4.0000 mg | ORAL_TABLET | Freq: Four times a day (QID) | ORAL | Status: DC | PRN

## 2014-01-17 MED ORDER — FENTANYL CITRATE 0.05 MG/ML IJ SOLN
INTRAMUSCULAR | Status: DC | PRN
  Administered 2014-01-17 (×2): 50 ug via INTRAVENOUS

## 2014-01-17 MED ORDER — ROCURONIUM BROMIDE 100 MG/10ML IV SOLN
INTRAVENOUS | Status: DC | PRN
  Administered 2014-01-17: 10 mg via INTRAVENOUS
  Administered 2014-01-17: 40 mg via INTRAVENOUS
  Administered 2014-01-17 (×2): 10 mg via INTRAVENOUS

## 2014-01-17 MED ORDER — ACETAMINOPHEN 325 MG PO TABS: 650.0000 mg | ORAL_TABLET | ORAL | Status: DC | PRN

## 2014-01-17 MED ORDER — HYDROMORPHONE HCL PF 1 MG/ML IJ SOLN: 0.2500 mg | INTRAMUSCULAR | Status: DC | PRN

## 2014-01-17 MED ORDER — GLYCOPYRROLATE 0.2 MG/ML IJ SOLN
INTRAMUSCULAR | Status: AC
  Filled 2014-01-17: qty 1

## 2014-01-17 MED ORDER — CALCIUM CARBONATE 1250 (500 CA) MG PO TABS
2.0000 | ORAL_TABLET | Freq: Three times a day (TID) | ORAL | Status: DC
  Administered 2014-01-17 – 2014-01-18 (×2): 1000 mg via ORAL
  Filled 2014-01-17 (×5): qty 2

## 2014-01-17 MED ORDER — GLYCOPYRROLATE 0.2 MG/ML IJ SOLN
INTRAMUSCULAR | Status: DC | PRN
  Administered 2014-01-17: 0.6 mg via INTRAVENOUS
  Administered 2014-01-17: 0.2 mg via INTRAVENOUS

## 2014-01-17 MED ORDER — NEOSTIGMINE METHYLSULFATE 1 MG/ML IJ SOLN
INTRAMUSCULAR | Status: DC | PRN
  Administered 2014-01-17: 5 mg via INTRAVENOUS

## 2014-01-17 MED ORDER — PROPOFOL 10 MG/ML IV BOLUS
INTRAVENOUS | Status: AC
  Filled 2014-01-17: qty 20

## 2014-01-17 MED ORDER — LACTATED RINGERS IV SOLN: INTRAVENOUS | Status: DC

## 2014-01-17 MED ORDER — ONDANSETRON HCL 4 MG/2ML IJ SOLN
INTRAMUSCULAR | Status: DC | PRN
  Administered 2014-01-17: 4 mg via INTRAVENOUS

## 2014-01-17 MED ORDER — LACTATED RINGERS IV SOLN
INTRAVENOUS | Status: DC | PRN
  Administered 2014-01-17 (×2): via INTRAVENOUS

## 2014-01-17 MED ORDER — NEOSTIGMINE METHYLSULFATE 1 MG/ML IJ SOLN
INTRAMUSCULAR | Status: AC
  Filled 2014-01-17: qty 10

## 2014-01-17 SURGICAL SUPPLY — 38 items
ATTRACTOMAT 16X20 MAGNETIC DRP (DRAPES) ×3 IMPLANT
BENZOIN TINCTURE PRP APPL 2/3 (GAUZE/BANDAGES/DRESSINGS) ×3 IMPLANT
BLADE HEX COATED 2.75 (ELECTRODE) ×3 IMPLANT
BLADE SURG 15 STRL LF DISP TIS (BLADE) ×1 IMPLANT
BLADE SURG 15 STRL SS (BLADE) ×2
CANISTER SUCTION 2500CC (MISCELLANEOUS) ×3 IMPLANT
CHLORAPREP W/TINT 10.5 ML (MISCELLANEOUS) ×3 IMPLANT
CLIP TI MEDIUM 6 (CLIP) ×12 IMPLANT
CLIP TI WIDE RED SMALL 6 (CLIP) ×12 IMPLANT
CLOSURE WOUND 1/2 X4 (GAUZE/BANDAGES/DRESSINGS) ×1
DISSECTOR ROUND CHERRY 3/8 STR (MISCELLANEOUS) IMPLANT
DRAPE PED LAPAROTOMY (DRAPES) ×3 IMPLANT
DRESSING SURGICEL FIBRLLR 1X2 (HEMOSTASIS) ×1 IMPLANT
DRSG SURGICEL FIBRILLAR 1X2 (HEMOSTASIS) ×3
ELECT REM PT RETURN 9FT ADLT (ELECTROSURGICAL) ×3
ELECTRODE REM PT RTRN 9FT ADLT (ELECTROSURGICAL) ×1 IMPLANT
GAUZE SPONGE 4X4 16PLY XRAY LF (GAUZE/BANDAGES/DRESSINGS) ×3 IMPLANT
GLOVE SURG ORTHO 8.0 STRL STRW (GLOVE) ×3 IMPLANT
GOWN STRL REUS W/TWL LRG LVL3 (GOWN DISPOSABLE) ×3 IMPLANT
GOWN STRL REUS W/TWL XL LVL3 (GOWN DISPOSABLE) ×6 IMPLANT
KIT BASIN OR (CUSTOM PROCEDURE TRAY) ×3 IMPLANT
NS IRRIG 1000ML POUR BTL (IV SOLUTION) ×3 IMPLANT
PACK BASIC VI WITH GOWN DISP (CUSTOM PROCEDURE TRAY) ×3 IMPLANT
PENCIL BUTTON HOLSTER BLD 10FT (ELECTRODE) ×3 IMPLANT
SHEARS HARMONIC 9CM CVD (BLADE) ×3 IMPLANT
SPONGE GAUZE 4X4 12PLY (GAUZE/BANDAGES/DRESSINGS) ×3 IMPLANT
STAPLER VISISTAT 35W (STAPLE) IMPLANT
STRIP CLOSURE SKIN 1/2X4 (GAUZE/BANDAGES/DRESSINGS) ×2 IMPLANT
SUT MNCRL AB 4-0 PS2 18 (SUTURE) ×3 IMPLANT
SUT SILK 2 0 (SUTURE) ×2
SUT SILK 2-0 18XBRD TIE 12 (SUTURE) ×1 IMPLANT
SUT SILK 3 0 (SUTURE)
SUT SILK 3-0 18XBRD TIE 12 (SUTURE) IMPLANT
SUT VIC AB 3-0 SH 18 (SUTURE) ×6 IMPLANT
SYR BULB IRRIGATION 50ML (SYRINGE) ×3 IMPLANT
TAPE CLOTH SURG 4X10 WHT LF (GAUZE/BANDAGES/DRESSINGS) ×3 IMPLANT
TOWEL OR 17X26 10 PK STRL BLUE (TOWEL DISPOSABLE) ×3 IMPLANT
YANKAUER SUCT BULB TIP 10FT TU (MISCELLANEOUS) ×3 IMPLANT

## 2014-01-17 NOTE — H&P (Signed)
Jose Mills is an 43 y.o. male.    General Surgery [facility]  Chief Complaint: right thyroid nodule with cytologic atypia  HPI: Patient is a 43 year old male followed for thyroid nodule. It has been 2 years since his last evaluation. At my request he underwent a thyroid ultrasound on 12/24/2013. This shows a normal sized thyroid gland. The dominant nodule in the right lobe at the inferior pole has increased in size in the interval to 17 x 10 x 14 mm. No other worrisome features are identified. Left lobe shows no nodules. There is no lymphadenopathy. TSH level remains normal at 2.089. The patient has never been on thyroid medication.  USN-guided FNA biopsy shows cytologic atypia worrisome for papillary thyroid carcinoma.  Patient presents today for right thyroid lobectomy with frozen section biopsy, possible total thyroidectomy.  Past Medical History  Diagnosis Date  . PONV (postoperative nausea and vomiting)   . Headache(784.0)     HX MIGRAINES  . Thyroid ca     Past Surgical History  Procedure Laterality Date  . Appendectomy    . Tympanostomy tube placement      History reviewed. No pertinent family history. Social History:  reports that he has never smoked. He does not have any smokeless tobacco history on file. He reports that he drinks alcohol. He reports that he does not use illicit drugs.  Allergies: No Known Allergies  No prescriptions prior to admission    No results found for this or any previous visit (from the past 48 hour(s)). No results found.  Review of Systems  All other systems reviewed and are negative.    Blood pressure 128/71, pulse 56, temperature 97.8 F (36.6 C), temperature source Oral, resp. rate 16, SpO2 100.00%. Physical Exam  Constitutional: He is oriented to person, place, and time. He appears well-developed and well-nourished. No distress.  HENT:  Head: Normocephalic and atraumatic.  Right Ear: External ear  normal.  Left Ear: External ear normal.  Eyes: Conjunctivae are normal. Pupils are equal, round, and reactive to light. No scleral icterus.  Neck: Normal range of motion. Neck supple. No tracheal deviation present. No thyromegaly present.  Palpable nodule right thyroid lobe approx 1.5 cm  Cardiovascular: Normal rate, regular rhythm and normal heart sounds.   Respiratory: Effort normal and breath sounds normal. He has no wheezes.  GI: Soft. Bowel sounds are normal. He exhibits no distension.  Musculoskeletal: Normal range of motion. He exhibits no edema.  Lymphadenopathy:    He has no cervical adenopathy.  Neurological: He is alert and oriented to person, place, and time.  Skin: Skin is warm and dry.  Psychiatric: He has a normal mood and affect. His behavior is normal.     Assessment/Plan Right thyroid nodule with atypia  Plan lobectomy and possible total thyroidectomy  The risks and benefits of the procedure have been discussed at length with the patient.  The patient understands the proposed procedure, potential alternative treatments, and the course of recovery to be expected.  All of the patient's questions have been answered at this time.  The patient wishes to proceed with surgery.  Earnstine Regal, MD, [facility] Office: [place] 01/17/2014, 7:10 AM

## 2014-01-17 NOTE — Transfer of Care (Signed)
Immediate Anesthesia Transfer of Care Note  Patient: Jose Mills  Procedure(s) Performed: Procedure(s): TOTAL THYROIDECTOMY WITH LIMITED LYMPH NODE DISSECTION  (Right)  Patient Location: PACU  Anesthesia Type:General  Level of Consciousness: sedated  Airway & Oxygen Therapy: Patient Spontanous Breathing and Patient connected to face mask oxygen  Post-op Assessment: Report given to PACU RN and Post -op Vital signs reviewed and stable  Post vital signs: Reviewed and stable  Complications: No apparent anesthesia complications

## 2014-01-17 NOTE — Preoperative (Signed)
Beta Blockers   Reason not to administer Beta Blockers:Not Applicable 

## 2014-01-17 NOTE — Brief Op Note (Signed)
01/17/2014  9:25 AM  PATIENT:  Merlyn Albert Tones  43 y.o. male  PRE-OPERATIVE DIAGNOSIS:  Thyroid nodule with atypia  POST-OPERATIVE DIAGNOSIS:  same   PROCEDURE:  Total thyroidectomy with central compartment lymph node dissection  SURGEON:  Surgeon(s) and Role:    * Earnstine Regal, MD - Primary  ANESTHESIA:   general  EBL:  Total I/O In: 1300 [I.V.:1300] Out: -   BLOOD ADMINISTERED:none  DRAINS: none   LOCAL MEDICATIONS USED:  NONE  SPECIMEN:  Excision  DISPOSITION OF SPECIMEN:  PATHOLOGY  COUNTS:  YES  TOURNIQUET:  * No tourniquets in log *  DICTATION: .Other Dictation: Dictation Number [id number]  PLAN OF CARE: Admit for overnight observation  PATIENT DISPOSITION:  PACU - hemodynamically stable.   Delay start of Pharmacological VTE agent (>24hrs) due to surgical blood loss or risk of bleeding: yes  Earnstine Regal, MD, [facility] Office: [phone]

## 2014-01-17 NOTE — Anesthesia Postprocedure Evaluation (Signed)
Anesthesia Post Note  Patient: Jose Baldy  Procedure(s) Performed: Procedure(s) (LRB): TOTAL THYROIDECTOMY WITH LIMITED LYMPH NODE DISSECTION  (Right)  Anesthesia type: General  Patient location: PACU  Post pain: Pain level controlled  Post assessment: Post-op Vital signs reviewed  Last Vitals:  Filed Vitals:   01/17/14 1010  BP: 123/71  Pulse:   Temp: 36.7 C  Resp:     Post vital signs: Reviewed  Level of consciousness: sedated  Complications: No apparent anesthesia complications

## 2014-01-18 ENCOUNTER — Encounter (HOSPITAL_COMMUNITY): Payer: Self-pay | Admitting: Surgery

## 2014-01-18 ENCOUNTER — Telehealth (INDEPENDENT_AMBULATORY_CARE_PROVIDER_SITE_OTHER): Payer: Self-pay

## 2014-01-18 ENCOUNTER — Other Ambulatory Visit (INDEPENDENT_AMBULATORY_CARE_PROVIDER_SITE_OTHER): Payer: Self-pay

## 2014-01-18 DIAGNOSIS — E039 Hypothyroidism, unspecified: Secondary | ICD-10-CM

## 2014-01-18 LAB — BASIC METABOLIC PANEL
BUN: 11 mg/dL (ref 6–23)
CHLORIDE: 102 meq/L (ref 96–112)
CO2: 28 meq/L (ref 19–32)
Calcium: 8.8 mg/dL (ref 8.4–10.5)
Creatinine, Ser: 1.03 mg/dL (ref 0.50–1.35)
GFR calc non Af Amer: 89 mL/min — ABNORMAL LOW (ref >90)
GLUCOSE: 101 mg/dL — AB (ref 70–99)
POTASSIUM: 4.2 meq/L (ref 3.7–5.3)
Sodium: 142 mEq/L (ref 137–147)

## 2014-01-18 MED ORDER — SYNTHROID 125 MCG PO TABS: 125.0000 ug | ORAL_TABLET | Freq: Every day | ORAL | Status: DC

## 2014-01-18 MED ORDER — CALCIUM CARBONATE 1250 (500 CA) MG PO TABS: 2.0000 | ORAL_TABLET | Freq: Two times a day (BID) | ORAL | Status: DC

## 2014-01-18 MED ORDER — HYDROCODONE-ACETAMINOPHEN 5-325 MG PO TABS: 1.0000 | ORAL_TABLET | ORAL | Status: DC | PRN

## 2014-01-18 NOTE — Discharge Summary (Signed)
Physician Discharge Summary [facility]  Patient ID: Jose Mills MRN: [id number] DOB/AGE: [birth year] 41 y.o.  Admit date: 01/17/2014 Discharge date: 01/18/2014  Admission Diagnoses:  Right thyroid nodule with atypia  Discharge Diagnoses:  Principal Problem:   THYROID NODULE, RIGHT Active Problems:   Neoplasm of uncertain behavior of thyroid gland   Discharged Condition: good  Hospital Course: Patient was admitted for observation following thyroid surgery.  Post op course was uncomplicated.  Pain was well controlled.  Tolerated diet.  Post op calcium level on morning following surgery was 8.8 mg/dl.  Patient was prepared for discharge home on POD#1.  Consults: None  Significant Diagnostic Studies: labs: calcium  Treatments: surgery: total thyroidectomy with limited lymph node dissection  Discharge Exam: Blood pressure 111/71, pulse 54, temperature 98.6 F (37 C), temperature source Oral, resp. rate 18, height 6\' 4"  (1.93 m), weight 230 lb 13.2 oz (104.7 kg), SpO2 99.00%. HEENT - clear Neck - wound clear and dry; voice normal Chest - clear bilaterally Cor - RRR  Disposition: Home with family  Discharge Orders   Future Appointments Provider Department Dept Phone   01/31/2014 3:30 PM Earnstine Regal, MD [facility] [place] [phone]   Future Orders Complete By Expires   Apply dressing  As directed    Scheduling Instructions:     Apply light gauze dressing to wound before discharge home today.   Diet - low sodium heart healthy  As directed    Discharge instructions  As directed    Comments:     THYROID & PARATHYROID SURGERY - POST OP INSTRUCTIONS  Always review your discharge instruction sheet from the facility where your surgery was performed.  A prescription for pain medication may be given to you upon discharge.  Take your pain medication as prescribed.  If narcotic pain medicine is not needed, then you may take acetaminophen  (Tylenol) or ibuprofen (Advil) as needed.  Take your usually prescribed medications unless otherwise directed.  If you need a refill on your pain medication, please contact your pharmacy. They will contact our office to request authorization.  Prescriptions will not be processed after 5 pm or on weekends.  Start with a light diet upon arrival home, such as soup and crackers or toast.  Be sure to drink plenty of fluids daily.  Resume your normal diet the day after surgery.  Most patients will experience some swelling and bruising on the chest and neck area.  Ice packs will help.  Swelling and bruising can take several days to resolve.   It is common to experience some constipation if taking pain medication after surgery.  Increasing fluid intake and taking a stool softener will usually help or prevent this problem.  A mild laxative (Milk of Magnesia or Miralax) should be taken according to package directions if there are no bowel movements after 48 hours.  You may remove your bandages 24-48 hours after surgery, and you may shower at that time.  You have steri-strips (small skin tapes) in place directly over the incision.  These strips should be left on the skin for 7-10 days and then removed.  You may resume regular (light) daily activities beginning the next day-such as daily self-care, walking, climbing stairs-gradually increasing activities as tolerated.  You may have sexual intercourse when it is comfortable.  Refrain from any heavy lifting or straining until approved by your doctor.  You may drive when you no longer are taking prescription pain medication, you can  comfortably wear a seatbelt, and you can safely maneuver your car and apply brakes.  You should see your doctor in the office for a follow-up appointment approximately two to three weeks after your surgery.  Make sure that you call for this appointment within a day or two after you arrive home to insure a convenient appointment  time.  WHEN TO CALL YOUR DOCTOR: -- Fever greater than 101.5 -- Inability to urinate -- Nausea and/or vomiting - persistent -- Extreme swelling or bruising -- Continued bleeding from incision -- Increased pain, redness, or drainage from the incision -- Difficulty swallowing or breathing -- Muscle cramping or spasms -- Numbness or tingling in hands or around lips  The clinic staff is available to answer your questions during regular business hours.  Please don't hesitate to call and ask to speak to one of the nurses if you have concerns.  Earnstine Regal, MD, [facility] Office: [phone]   Increase activity slowly  As directed    Remove dressing in 24 hours  As directed        Medication List         calcium carbonate 1250 MG tablet  Commonly known as:  OS-CAL - dosed in mg of elemental calcium  Take 2 tablets (1,000 mg of elemental calcium total) by mouth 2 (two) times daily with a meal.     HYDROcodone-acetaminophen 5-325 MG per tablet  Commonly known as:  NORCO/VICODIN  Take 1-2 tablets by mouth every 4 (four) hours as needed for moderate pain.           Follow-up Information   Follow up with Earnstine Regal, MD. Schedule an appointment as soon as possible for a visit in 2 weeks.   Specialty:  General Surgery   Contact information:   [address] [place] [address] [phone]       Earnstine Regal, MD, [facility] Office: [phone]   Signed: Earnstine Regal 01/18/2014, 9:23 AM

## 2014-01-18 NOTE — Op Note (Signed)
NAMETEDFORD, BERG NO.:  [id number]  MEDICAL RECORD NO.:  [id number]  LOCATION:  2297                         FACILITY:  [facility]  PHYSICIAN:  Earnstine Regal, MD      DATE OF BIRTH:  1971/03/14  DATE OF PROCEDURE:  01/17/2014                              OPERATIVE REPORT   PREOPERATIVE DIAGNOSIS:  Right thyroid nodule with cytologic atypia.  POSTOPERATIVE DIAGNOSIS:  Right thyroid nodule, suspicious for follicular variant of papillary thyroid carcinoma.  PROCEDURE:  Total thyroidectomy with limited central compartment lymph node dissection.  SURGEON:  Earnstine Regal, MD, FACS  ANESTHESIA:  General per Dr. Freddie Apley.  ESTIMATED BLOOD LOSS:  Minimal.  PREPARATION:  ChloraPrep.  COMPLICATIONS:  None.  INDICATIONS:  The patient is a 43 year old male with thyroid nodule in the right thyroid lobe.  This has been followed with sequential ultrasound scanning.  There was an interval increase in size of a dominant right inferior pole nodule.  Fine-needle aspiration biopsy was obtained and showed cytologic atypia worrisome for carcinoma.  The patient now comes to Surgery for resection, for definitive diagnosis.  BODY OF REPORT:  Procedures done in OR #3 at the [facility].  The patient was brought to the operating room, placed in supine position on the operating room table.  Following administration of general anesthesia, the patient was positioned and then prepped and draped in the usual strict aseptic fashion.  After ascertaining that an adequate level of anesthesia had been achieved, a Kocher incision was made with a #15 blade.  Dissection was carried through subcutaneous tissues and platysma.  Hemostasis was achieved with the electrocautery. Skin flaps were elevated cephalad and caudad from the thyroid notch to the sternal notch.  A Mahorner self-retaining retractor was placed for exposure.  Strap muscles were incised in the  midline.  Left thyroid lobe was exposed.  It appears grossly normal.  There were no palpable nodules.  There was no obvious lymphadenopathy.  Next, we turned our attention to the right thyroid lobe.  Strap muscles were reflected laterally exposing the right lobe.  There was a dominant nodule in the inferior pole.  There was no lymphadenopathy palpable on the right.  The middle thyroid vein was divided between Ligaclips. Gland was gently dissected out.  Superior pole vessels were divided individually between small and medium Ligaclips with a Harmonic scalpel. Parathyroid tissues were identified and preserved.  Inferior venous tributaries were divided between Ligaclips.  Gland was rolled anteriorly.  Branches of the inferior thyroid artery were divided between small Ligaclips with a Harmonic scalpel.  Recurrent laryngeal nerve was identified and preserved.  Ligament of Gwenlyn Found was released with the electrocautery and the gland was mobilized onto the anterior trachea.  Isthmus was mobilized across the midline.  There was no significant pyramidal lobe present.  Thyroid tissue was transected at the junction of the isthmus with the left thyroid lobe using a Harmonic scalpel.  Right lobe was submitted to Pathology for frozen section.  Frozen section biopsy was performed by Dr. Evalee Jefferson.  Dr. Saralyn Pilar sees a suspicious lesion.  He is reading this out as thyroid neoplasm. He suspects that this represents a  follicular variant of papillary thyroid carcinoma.  With that diagnosis, the procedure was continued.  Central compartment lymph nodes were dissected out from the right carotid artery to the left carotid artery and inferiorly to the innominate.  This tissue was resected using the electrocautery, the Harmonic Scalpel, and medium Ligaclips.  Specimen was submitted separately, labeled as central compartment lymph nodes.  Next, we proceeded with left thyroid lobectomy.  Strap muscles  were reflected laterally.  Middle thyroid veins were divided between medium Ligaclips with a Harmonic scalpel.  Superior pole was dissected out. Superior pole vessels divided between small and medium Ligaclips with a Harmonic scalpel.  Parathyroid tissue was identified and preserved. Gland was rolled anteriorly.  Inferior venous tributaries were divided between Ligaclips.  Branches of the inferior thyroid artery were divided between small Ligaclips with a Harmonic scalpel.  Recurrent laryngeal nerve was identified and preserved.  Ligament of Gwenlyn Found was released with the electrocautery and the gland was mobilized onto the anterior trachea from which it was completely excised with the electrocautery.  It was submitted as left thyroid lobe.  The neck was irrigated with warm saline.  Good hemostasis was achieved throughout.  Fibrillar was placed throughout the operative field.  Strap muscles were reapproximated in the midline with interrupted 3-0 Vicryl sutures.  Platysma was closed with interrupted 3-0 Vicryl sutures.  Skin was closed with a running 4-0 Monocryl subcuticular suture.  Wound was washed and dried and benzoin and Steri-Strips were applied.  Sterile dressings were applied.  The patient was awakened from anesthesia and brought to the recovery room.  The patient tolerated the procedure well.   Earnstine Regal, MD, [facility] Office: [phone]   TMG/MEDQ  D:  01/17/2014  T:  01/18/2014  Job:  [id number]  cc:   [name], M.D.

## 2014-01-18 NOTE — Progress Notes (Signed)
Pt to be discharged home.  In stable condition, no change from morning assessment.  Discharge instructions and script provided to pt and spouse with verbal understanding.  No further questions or concerns at this time.

## 2014-01-18 NOTE — Telephone Encounter (Signed)
Referral to Dr Forde Dandy in epic and to [place] to make appt. Lab slip mailed to pt.

## 2014-01-29 LAB — CALCIUM: Calcium: 8.6 mg/dL (ref 8.4–10.5)

## 2014-01-29 NOTE — Progress Notes (Signed)
Quick Note:  Post op calcium level is normal. May discontinue additional calcium supplements at this time.  Earnstine Regal, MD, [facility] Office: [phone]    ______

## 2014-01-30 ENCOUNTER — Telehealth (INDEPENDENT_AMBULATORY_CARE_PROVIDER_SITE_OTHER): Payer: Self-pay

## 2014-01-30 NOTE — Telephone Encounter (Signed)
LMOM for pt to call. Per Dr Harlow Asa calcium is normal and pt can d/c calcium suppliments.

## 2014-01-30 NOTE — Telephone Encounter (Signed)
Patient called back and was given below message.  Patient states understanding.

## 2014-01-30 NOTE — Telephone Encounter (Signed)
I verified pt has appt with Dr Forde Dandy on 02-06-14 per Dr Baldwin [place] office.

## 2014-01-31 ENCOUNTER — Encounter (INDEPENDENT_AMBULATORY_CARE_PROVIDER_SITE_OTHER): Payer: BC Managed Care – PPO | Admitting: Surgery

## 2014-02-13 ENCOUNTER — Encounter (INDEPENDENT_AMBULATORY_CARE_PROVIDER_SITE_OTHER): Payer: Self-pay | Admitting: Surgery

## 2014-02-13 ENCOUNTER — Ambulatory Visit (INDEPENDENT_AMBULATORY_CARE_PROVIDER_SITE_OTHER): Payer: BC Managed Care – PPO | Admitting: Surgery

## 2014-02-13 VITALS — BP 116/82 | HR 60 | Resp 12 | Ht 76.0 in | Wt 227.2 lb

## 2014-02-13 DIAGNOSIS — C73 Malignant neoplasm of thyroid gland: Secondary | ICD-10-CM

## 2014-02-13 NOTE — Patient Instructions (Signed)
  COCOA BUTTER & VITAMIN E CREAM  (Palmer's or other brand)  Apply cocoa butter/vitamin E cream to your incision 2 - 3 times daily.  Massage cream into incision for one minute with each application.  Use sunscreen (50 SPF or higher) for first 6 months after surgery if area is exposed to sun.  You may substitute Mederma or other scar reducing creams as desired.   

## 2014-02-13 NOTE — Progress Notes (Signed)
General Surgery [facility]  Chief Complaint  Patient presents with  . Routine Post Op    total thyroidectomy with lymph node dissection 01/17/2014    HISTORY: Patient is a 43 year old male who underwent total thyroidectomy with limited lymph node dissection on 96/22/2979 for follicular variant of papillary thyroid carcinoma. A second 2 mm focus of cancer was found in the opposite thyroid lobe. 4 lymph nodes were negative for metastatic disease. Patient was seen by Dr. Carrolyn Meiers in endocrinology. He has been scheduled for a total body iodine scan. His dose of Synthroid was increased to 150 mcg daily.  Patient is doing well. He is anxious to return to exercise. He has had no complications.  EXAM: Surgical wound is healing nicely. Minimal soft tissue swelling. No sign of seroma. No sign of infection. Voice quality is normal.  IMPRESSION: Follicular variant of papillary thyroid carcinoma, multifocal  PLAN: Patient will see Dr. Forde Dandy in follow-up in the near future. He will monitor his laboratory studies and will obtain a total body iodine scan.  Patient will return for surgical care in 6-8 weeks for a wound check.  Earnstine Regal, MD, [facility]   Visit Diagnoses: 1. Papillary thyroid carcinoma, follicular variant, multifocal

## 2014-02-28 ENCOUNTER — Other Ambulatory Visit (HOSPITAL_COMMUNITY): Payer: Self-pay | Admitting: Endocrinology

## 2014-02-28 DIAGNOSIS — C73 Malignant neoplasm of thyroid gland: Secondary | ICD-10-CM

## 2014-03-15 ENCOUNTER — Encounter (HOSPITAL_COMMUNITY)
Admission: RE | Admit: 2014-03-15 | Discharge: 2014-03-15 | Disposition: A | Discharge status: Home / Self Care | Payer: BC Managed Care – PPO | Source: Ambulatory Visit | Attending: Endocrinology | Admitting: Endocrinology

## 2014-03-15 DIAGNOSIS — C73 Malignant neoplasm of thyroid gland: Secondary | ICD-10-CM | POA: Insufficient documentation

## 2014-03-18 ENCOUNTER — Encounter (HOSPITAL_COMMUNITY)
Admission: RE | Admit: 2014-03-18 | Discharge: 2014-03-18 | Disposition: A | Discharge status: Home / Self Care | Payer: BC Managed Care – PPO | Source: Ambulatory Visit | Attending: Endocrinology | Admitting: Endocrinology

## 2014-03-18 ENCOUNTER — Encounter (HOSPITAL_COMMUNITY): Payer: Self-pay

## 2014-03-18 MED ORDER — SODIUM IODIDE I 131 CAPSULE
3.2000 | Freq: Once | INTRAVENOUS | Status: AC | PRN
  Administered 2014-03-18: 3.2 via ORAL

## 2014-03-22 ENCOUNTER — Encounter (HOSPITAL_COMMUNITY): Payer: Self-pay

## 2014-03-22 ENCOUNTER — Other Ambulatory Visit (HOSPITAL_COMMUNITY): Payer: Self-pay | Admitting: Endocrinology

## 2014-03-22 ENCOUNTER — Encounter (HOSPITAL_COMMUNITY)
Admission: RE | Admit: 2014-03-22 | Discharge: 2014-03-22 | Disposition: A | Discharge status: Home / Self Care | Payer: BC Managed Care – PPO | Source: Ambulatory Visit | Attending: Endocrinology | Admitting: Endocrinology

## 2014-03-22 DIAGNOSIS — C73 Malignant neoplasm of thyroid gland: Secondary | ICD-10-CM

## 2014-03-22 MED ORDER — SODIUM IODIDE I 131 CAPSULE
49.0000 | Freq: Once | INTRAVENOUS | Status: AC | PRN
  Administered 2014-03-22: 49 via ORAL

## 2014-03-25 ENCOUNTER — Encounter (INDEPENDENT_AMBULATORY_CARE_PROVIDER_SITE_OTHER): Payer: Self-pay | Admitting: Surgery

## 2014-03-25 ENCOUNTER — Ambulatory Visit (INDEPENDENT_AMBULATORY_CARE_PROVIDER_SITE_OTHER): Payer: BC Managed Care – PPO | Admitting: Surgery

## 2014-03-25 VITALS — BP 124/80 | HR 78 | Temp 97.5°F | Ht 76.0 in | Wt 229.0 lb

## 2014-03-25 DIAGNOSIS — C73 Malignant neoplasm of thyroid gland: Secondary | ICD-10-CM

## 2014-03-25 NOTE — Patient Instructions (Signed)
  CARE OF INCISION   Apply cocoa butter/vitamin E cream (Palmer's brand) to your incision 2 - 3 times daily.  Massage cream into incision for one minute with each application.  Use sunscreen (50 SPF or higher) for first 6 months after surgery if area is exposed to sun.  You may alternate Mederma or other scar reducing cream with cocoa butter cream if desired.        M. , MD, FACS      [facility]      Office: [phone]    

## 2014-03-25 NOTE — Progress Notes (Signed)
General Surgery [facility]  Chief Complaint  Patient presents with  . Routine Post Op    total thyroidectomy on 01/17/2014    HISTORY: Patient is a 43 year old male who underwent total thyroidectomy in February for multifocal follicular variant of papillary thyroid carcinoma. Patient had a total body iodine scan performed last week which showed only activity in the thyroid bed with no evidence of metastatic disease. Patient was then treated with 49 mCi of I-131 on 03/22/2014.  Patient returns today for scheduled post-operative visit.  EXAM: Surgical incision is healing nicely. Minimal soft tissue swelling. Voice quality is normal. No palpable masses in the thyroid bed. No palpable lymphadenopathy.  IMPRESSION: Multifocal follicular variant of papillary thyroid carcinoma  PLAN: Patient will continue with thyroid hormone replacement under the direction of Dr. Forde Dandy. Patient will return to see me in 4 months.  Earnstine Regal, MD, [facility]   Visit Diagnoses: 1. Papillary thyroid carcinoma, follicular variant, multifocal

## 2014-04-01 ENCOUNTER — Encounter (HOSPITAL_COMMUNITY)
Admission: RE | Admit: 2014-04-01 | Discharge: 2014-04-01 | Disposition: A | Discharge status: Home / Self Care | Payer: BC Managed Care – PPO | Source: Ambulatory Visit | Attending: Endocrinology | Admitting: Endocrinology

## 2014-04-01 DIAGNOSIS — C73 Malignant neoplasm of thyroid gland: Secondary | ICD-10-CM | POA: Insufficient documentation

## 2014-07-29 ENCOUNTER — Other Ambulatory Visit: Payer: Self-pay | Admitting: Urology

## 2014-07-29 DIAGNOSIS — IMO0002 Reserved for concepts with insufficient information to code with codable children: Secondary | ICD-10-CM

## 2014-07-29 DIAGNOSIS — N5089 Other specified disorders of the male genital organs: Secondary | ICD-10-CM | POA: Insufficient documentation

## 2014-07-29 DIAGNOSIS — R229 Localized swelling, mass and lump, unspecified: Principal | ICD-10-CM

## 2014-08-01 ENCOUNTER — Other Ambulatory Visit: Payer: BC Managed Care – PPO

## 2014-08-02 ENCOUNTER — Ambulatory Visit
Admission: RE | Admit: 2014-08-02 | Discharge: 2014-08-02 | Disposition: A | Discharge status: Home / Self Care | Payer: BC Managed Care – PPO | Source: Ambulatory Visit | Attending: Urology | Admitting: Urology

## 2014-08-02 DIAGNOSIS — IMO0002 Reserved for concepts with insufficient information to code with codable children: Secondary | ICD-10-CM

## 2014-08-02 DIAGNOSIS — R229 Localized swelling, mass and lump, unspecified: Principal | ICD-10-CM

## 2014-08-06 ENCOUNTER — Other Ambulatory Visit: Payer: Self-pay | Admitting: Urology

## 2014-08-06 DIAGNOSIS — I861 Scrotal varices: Secondary | ICD-10-CM

## 2014-08-06 DIAGNOSIS — N5089 Other specified disorders of the male genital organs: Secondary | ICD-10-CM

## 2014-08-09 ENCOUNTER — Ambulatory Visit
Admission: RE | Admit: 2014-08-09 | Discharge: 2014-08-09 | Disposition: A | Discharge status: Home / Self Care | Payer: BC Managed Care – PPO | Source: Ambulatory Visit | Attending: Urology | Admitting: Urology

## 2014-08-09 DIAGNOSIS — N5089 Other specified disorders of the male genital organs: Secondary | ICD-10-CM

## 2014-08-09 DIAGNOSIS — I861 Scrotal varices: Secondary | ICD-10-CM

## 2014-08-09 MED ORDER — IOHEXOL 300 MG/ML  SOLN
125.0000 mL | Freq: Once | INTRAMUSCULAR | Status: AC | PRN
  Administered 2014-08-09: 125 mL via INTRAVENOUS

## 2014-08-26 ENCOUNTER — Ambulatory Visit (INDEPENDENT_AMBULATORY_CARE_PROVIDER_SITE_OTHER): Payer: BC Managed Care – PPO | Admitting: Surgery

## 2015-01-13 IMAGING — US US SOFT TISSUE HEAD/NECK
1 series · 14 of 25 positions shown · non-contrast
Comparison: 12/10/2011

CLINICAL DATA: Thyroid nodules

EXAM:
THYROID ULTRASOUND
TECHNIQUE: Ultrasound examination of the thyroid gland and adjacent soft
tissues was performed.

[Series 1: us soft tissue head/neck · 0.08mm/px · 14 of 49 slices shown]
[im 1/49]
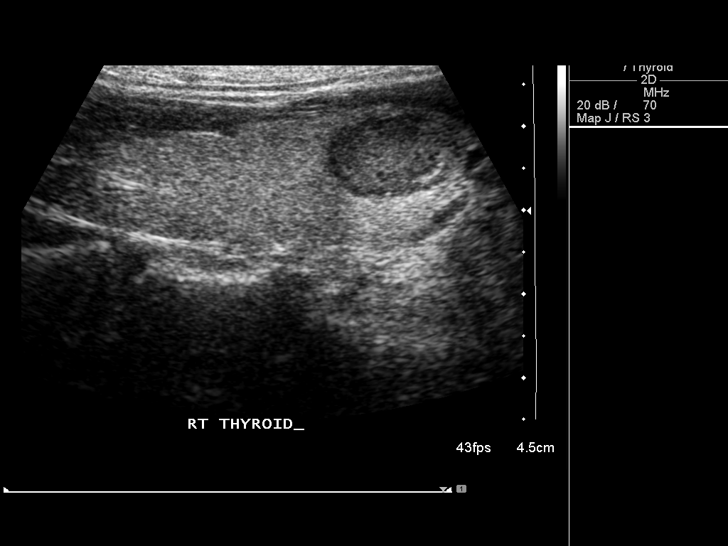
[im 5/49]
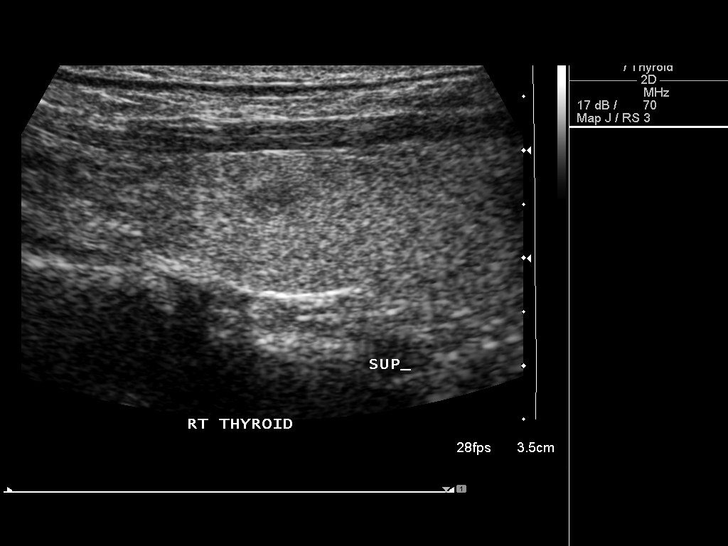
[im 9/49]
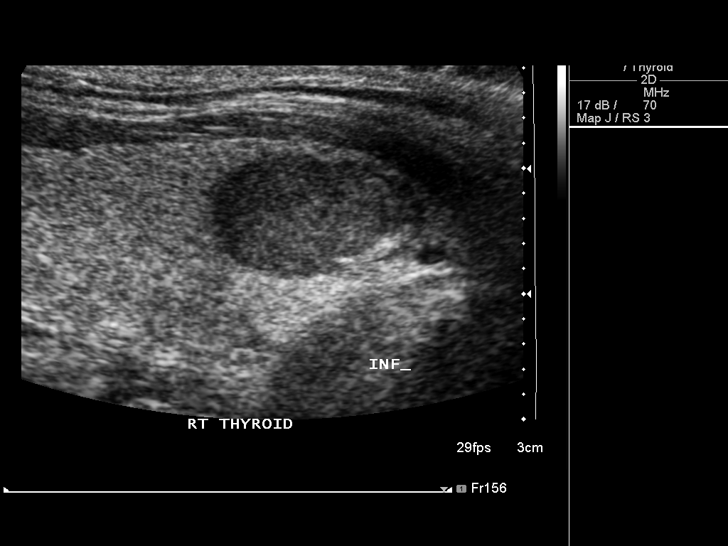
[im 13/49]
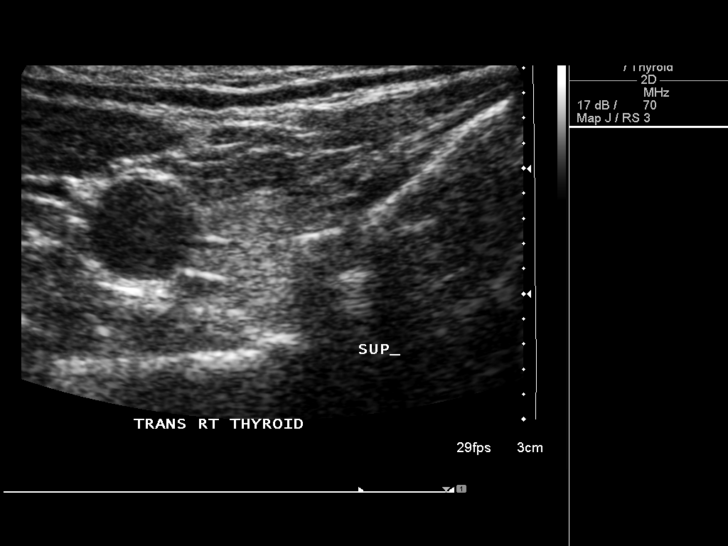
[im 17/49]
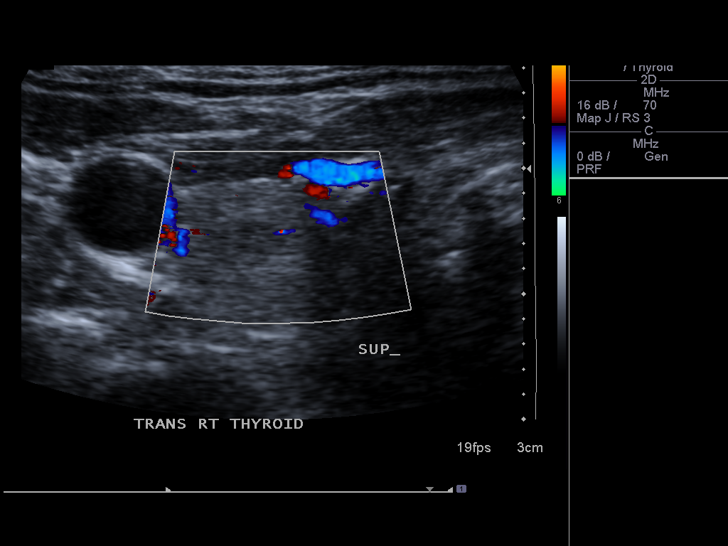
[im 19/49]
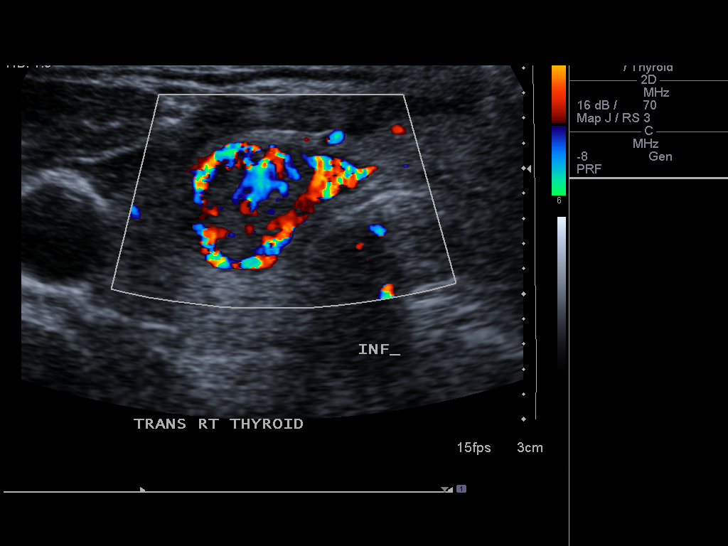
[im 23/49]
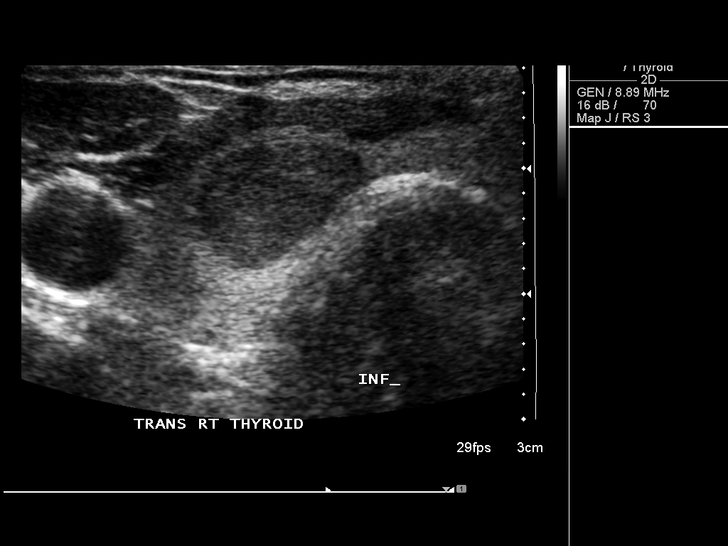
[im 27/49]
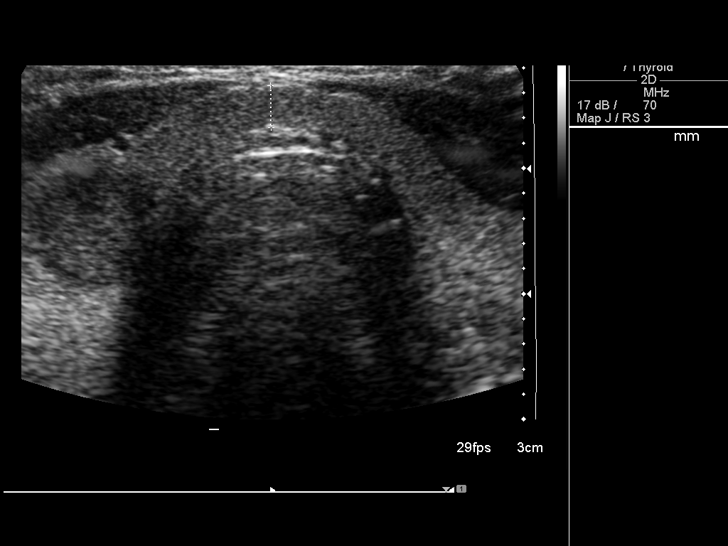
[im 31/49]
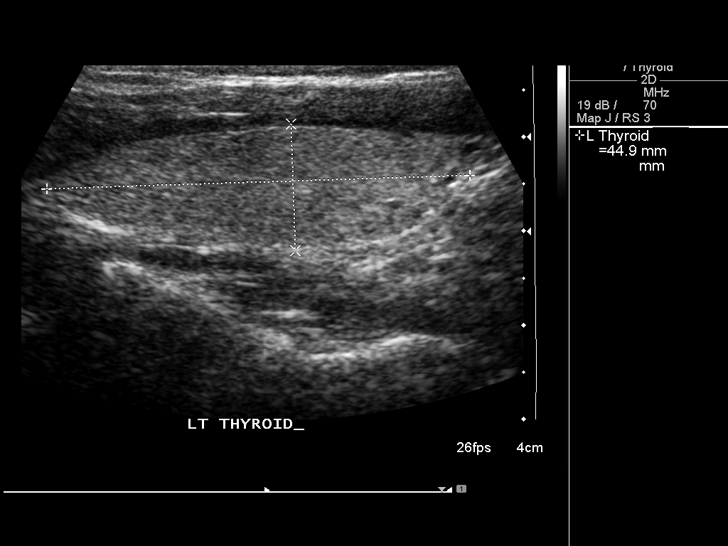
[im 33/49]
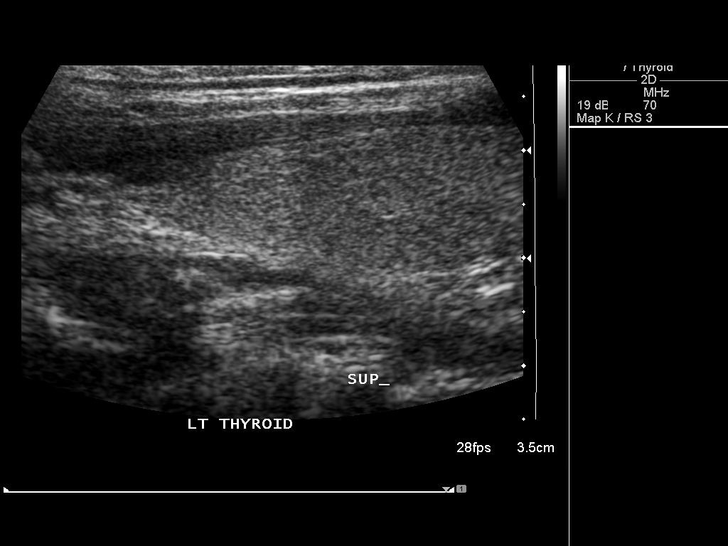
[im 37/49]
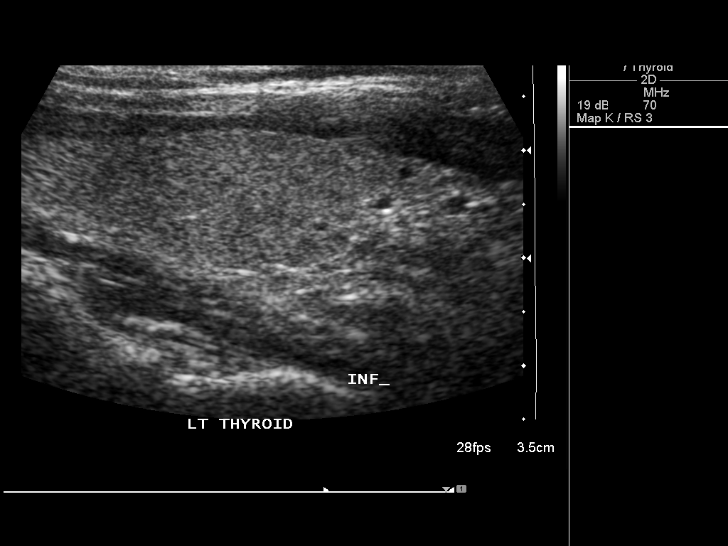
[im 41/49]
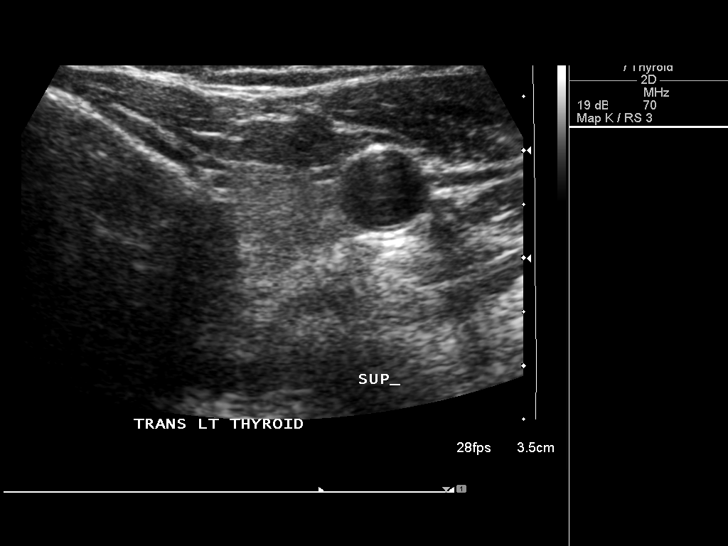
[im 45/49]
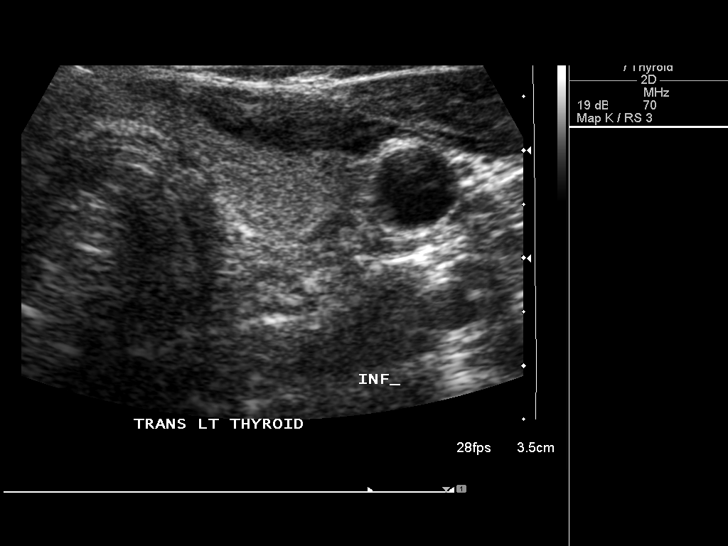
[im 49/49]
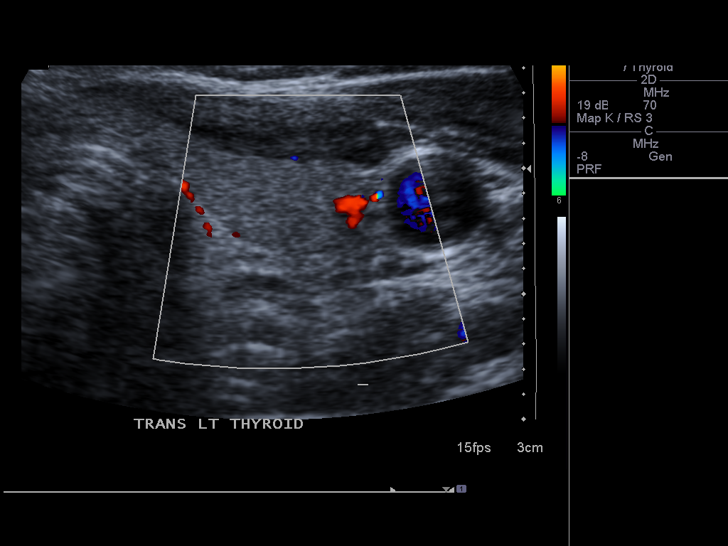

[14 of 25 positions shown; findings below may reference images not displayed]

FINDINGS: Right thyroid lobe

Measurements: 49 x 16 x 17 mm.  Two distinct nodules:

5 x 3 x 5 mm hypoechoic, superior pole (previously 5 mm)

Left thyroid lobe

Measurements: 45 x 13 x 14 mm.  No nodules visualized.

Isthmus

Thickness: 3 mm.  No nodules visualized.

Lymphadenopathy

None visualized.
IMPRESSION: 1. Slight interval enlargement pf dominant right nodule. Findings
meet consensus criteria for biopsy. Ultrasound-guided fine needle
aspiration should be considered, as per the consensus statement:
Management of Thyroid Nodules Detected at US: Society of
Radiologists in Ultrasound Consensus Conference Statement. Radiology

## 2015-01-23 IMAGING — US US THYROID BIOPSY
1 series · 8 of 8 positions shown · non-contrast
Comparison: US SOFT TISSUE HEAD/NECK dated 12/24/2013

CLINICAL DATA: Enlarging right thyroid nodule with maximum
dimensions of 1.7 cm.

EXAM:
ULTRASOUND GUIDED NEEDLE ASPIRATE BIOPSY OF THE THYROID GLAND

[Series 1: us thyroid biopsy · 0.05mm/px · 8 acquisitions, 8 frames shown]
[im 1/8]
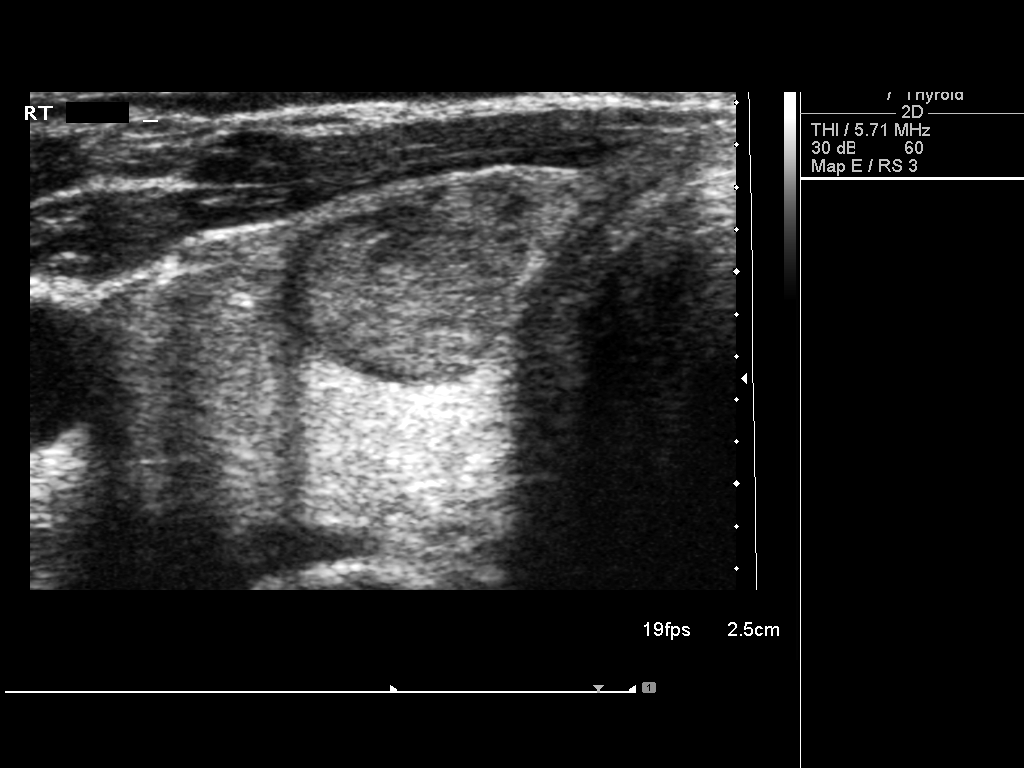
[im 2/8]
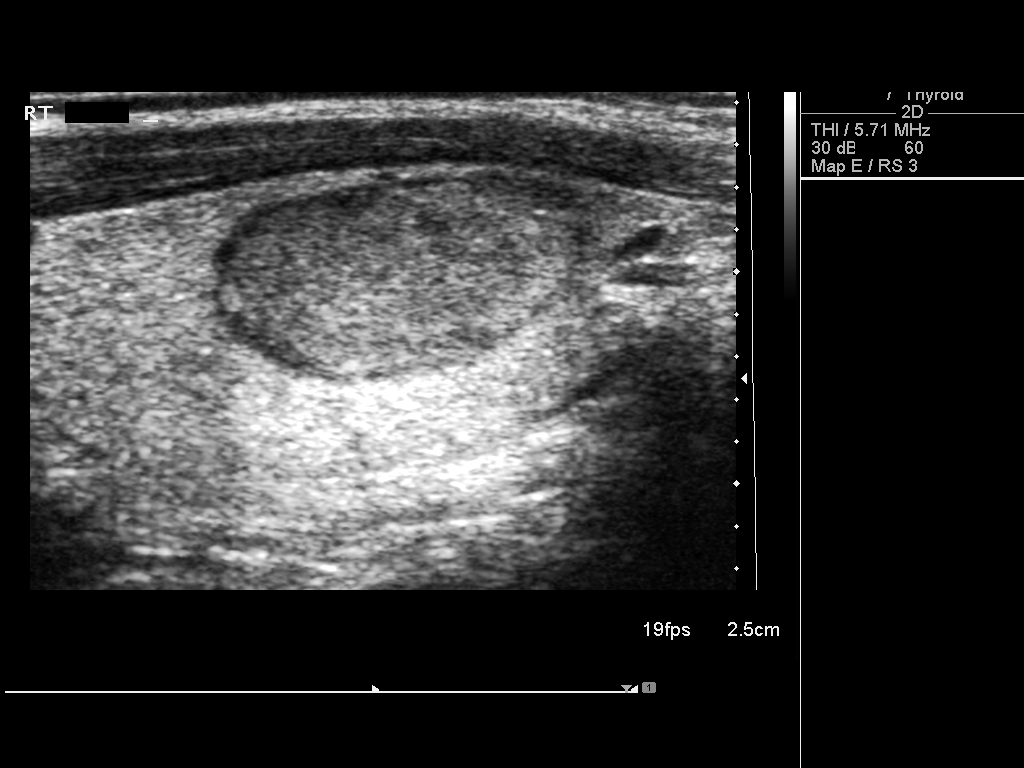
[im 3/8]
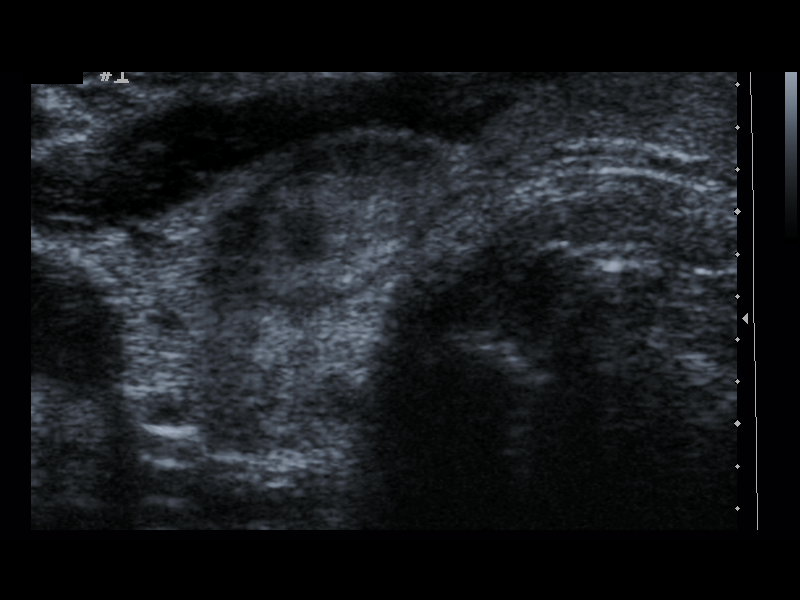
[im 4/8]
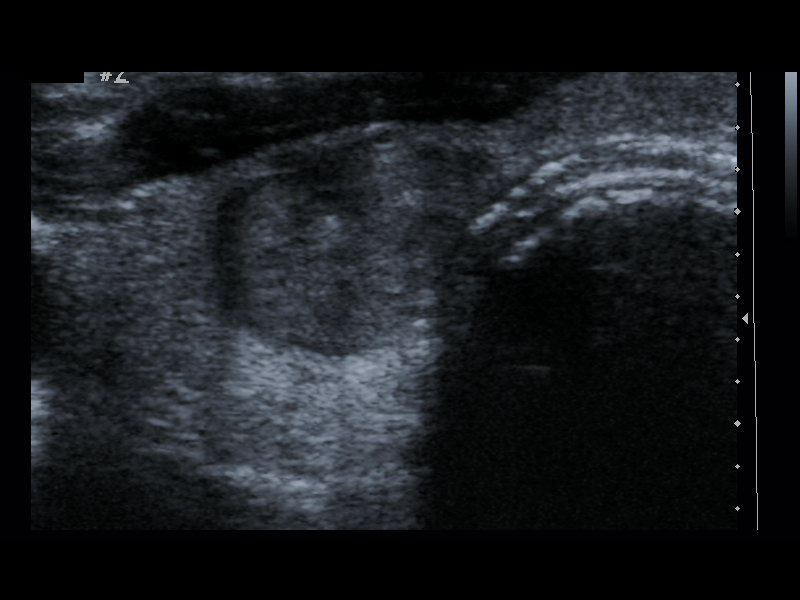
[im 5/8]
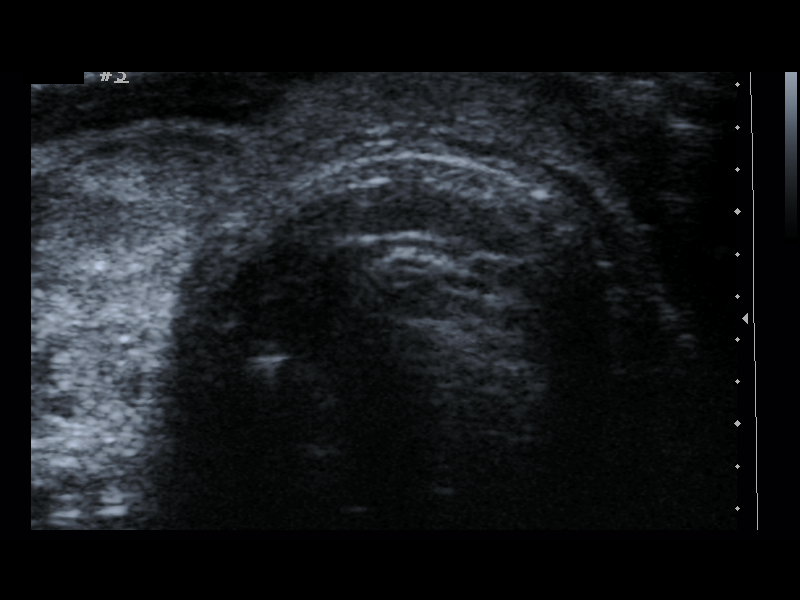
[im 6/8]
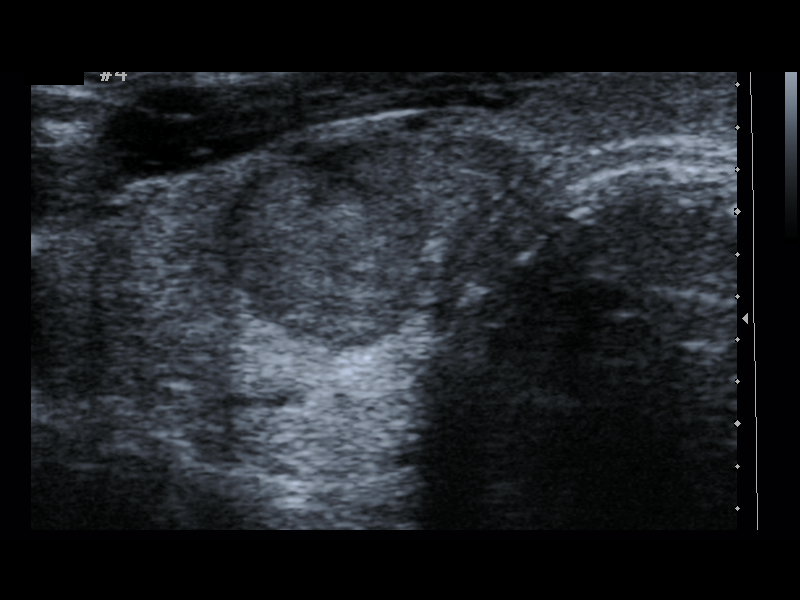
[im 7/8]
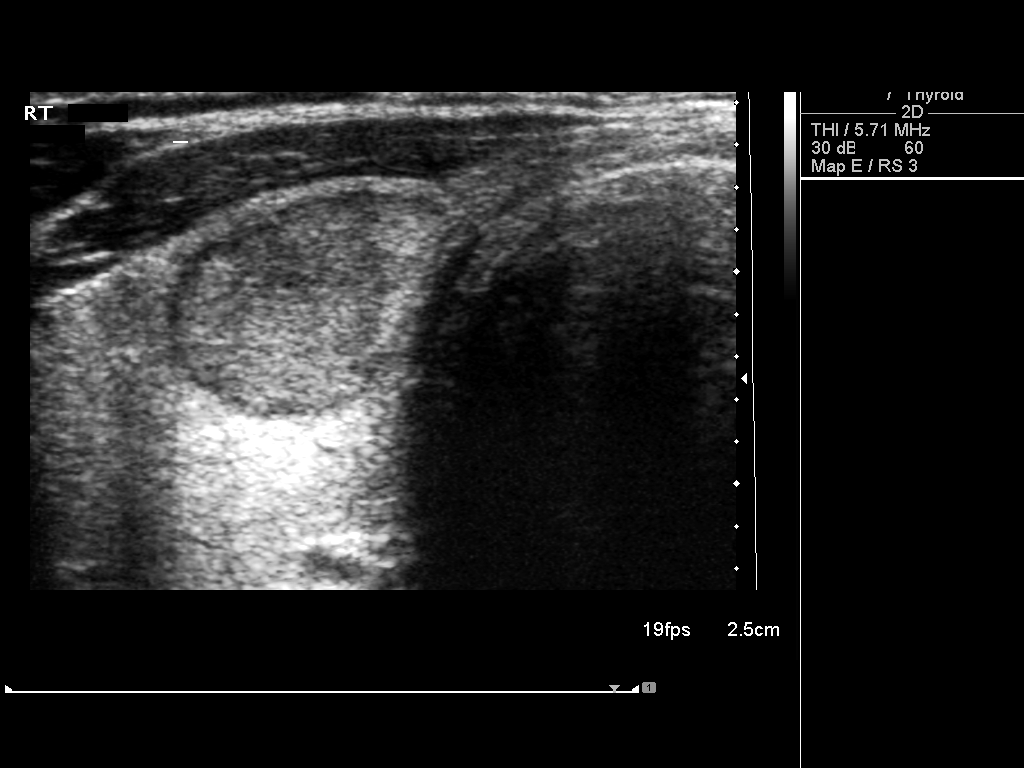
[im 8/8]
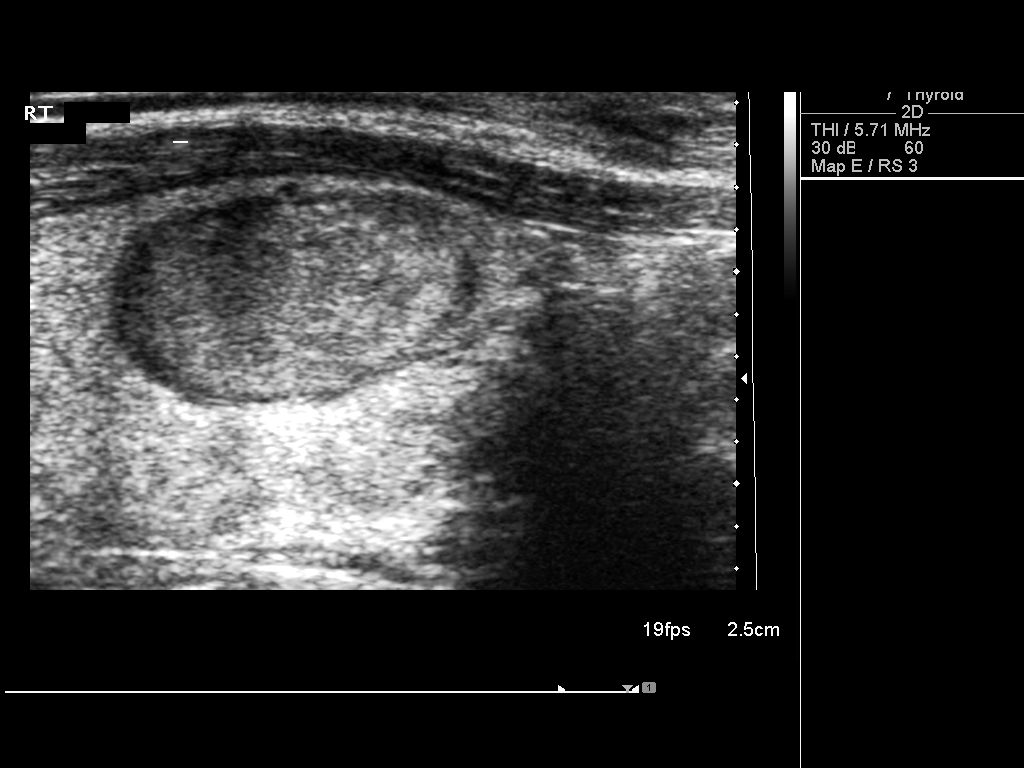

[8 of 8 positions shown; findings below may reference images not displayed]

FINDINGS: Needle aspirate samples were obtained in the dominant right thyroid
nodule.
IMPRESSION: Ultrasound guided needle aspirate biopsy performed of the dominant
right thyroid nodule.

PROCEDURE:
Thyroid biopsy was thoroughly discussed with the patient and
questions were answered. The benefits, risks, alternatives, and
complications were also discussed. The patient understands and
wishes to proceed with the procedure. Written consent was obtained.

Ultrasound was performed to localize and mark an adequate site for
the biopsy. The patient was then prepped and draped in a normal
sterile fashion. Local anesthesia was provided with 1% lidocaine.
Using direct ultrasound guidance, 4 passes were made using 25 gauge
needles into the nodule within the right lobe of the thyroid.
Ultrasound was used to confirm needle placements on all occasions.
Specimens were sent to Pathology for analysis.

Complications:  None

## 2015-01-24 DIAGNOSIS — N4340 Spermatocele of epididymis, unspecified: Secondary | ICD-10-CM | POA: Insufficient documentation

## 2015-02-03 IMAGING — CR DG CHEST 2V
2 series · 2 of 2 positions shown · non-contrast
Comparison: None.

CLINICAL DATA: Preop for thyroidectomy.

EXAM:
CHEST  2 VIEW

[w chest pa]
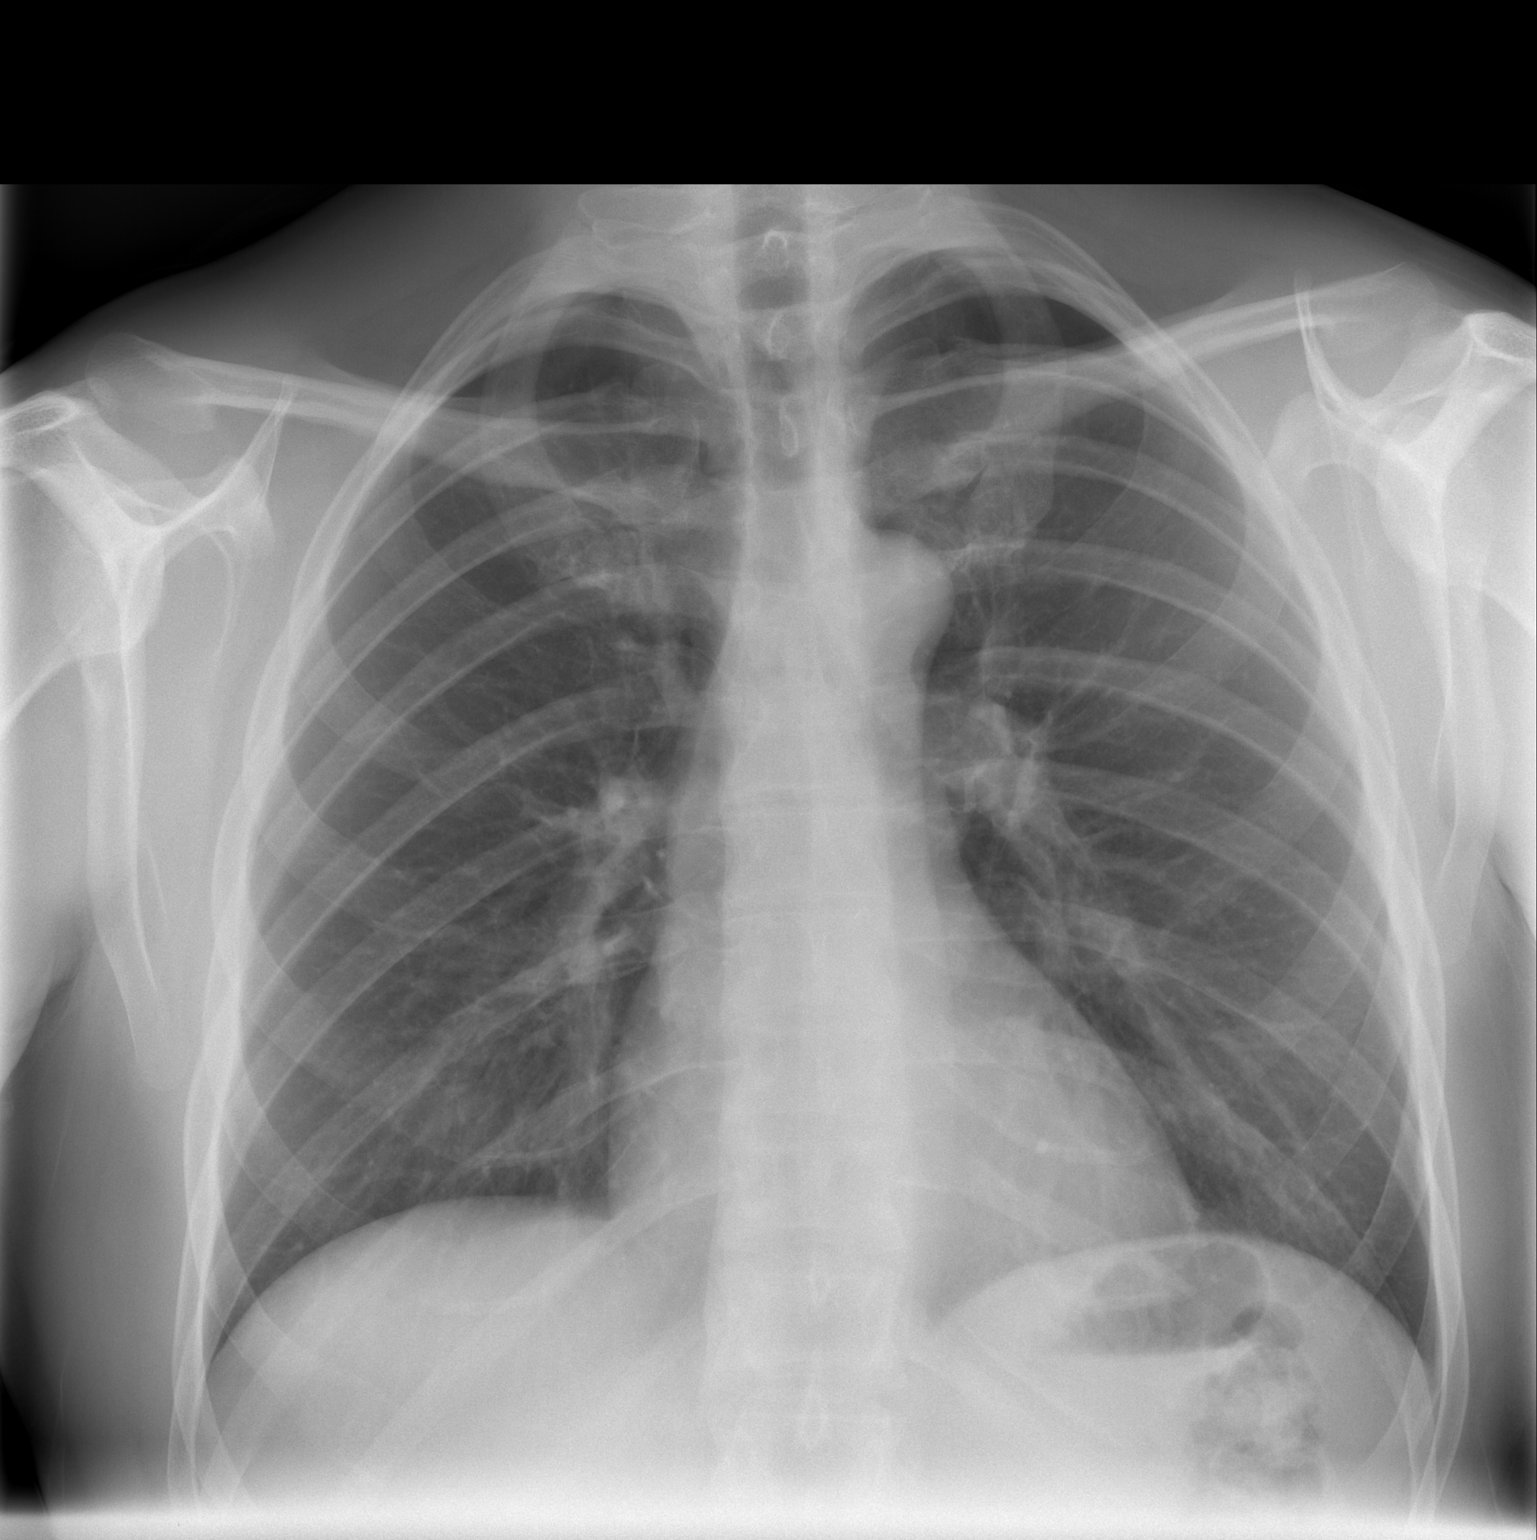

[w chest lat]
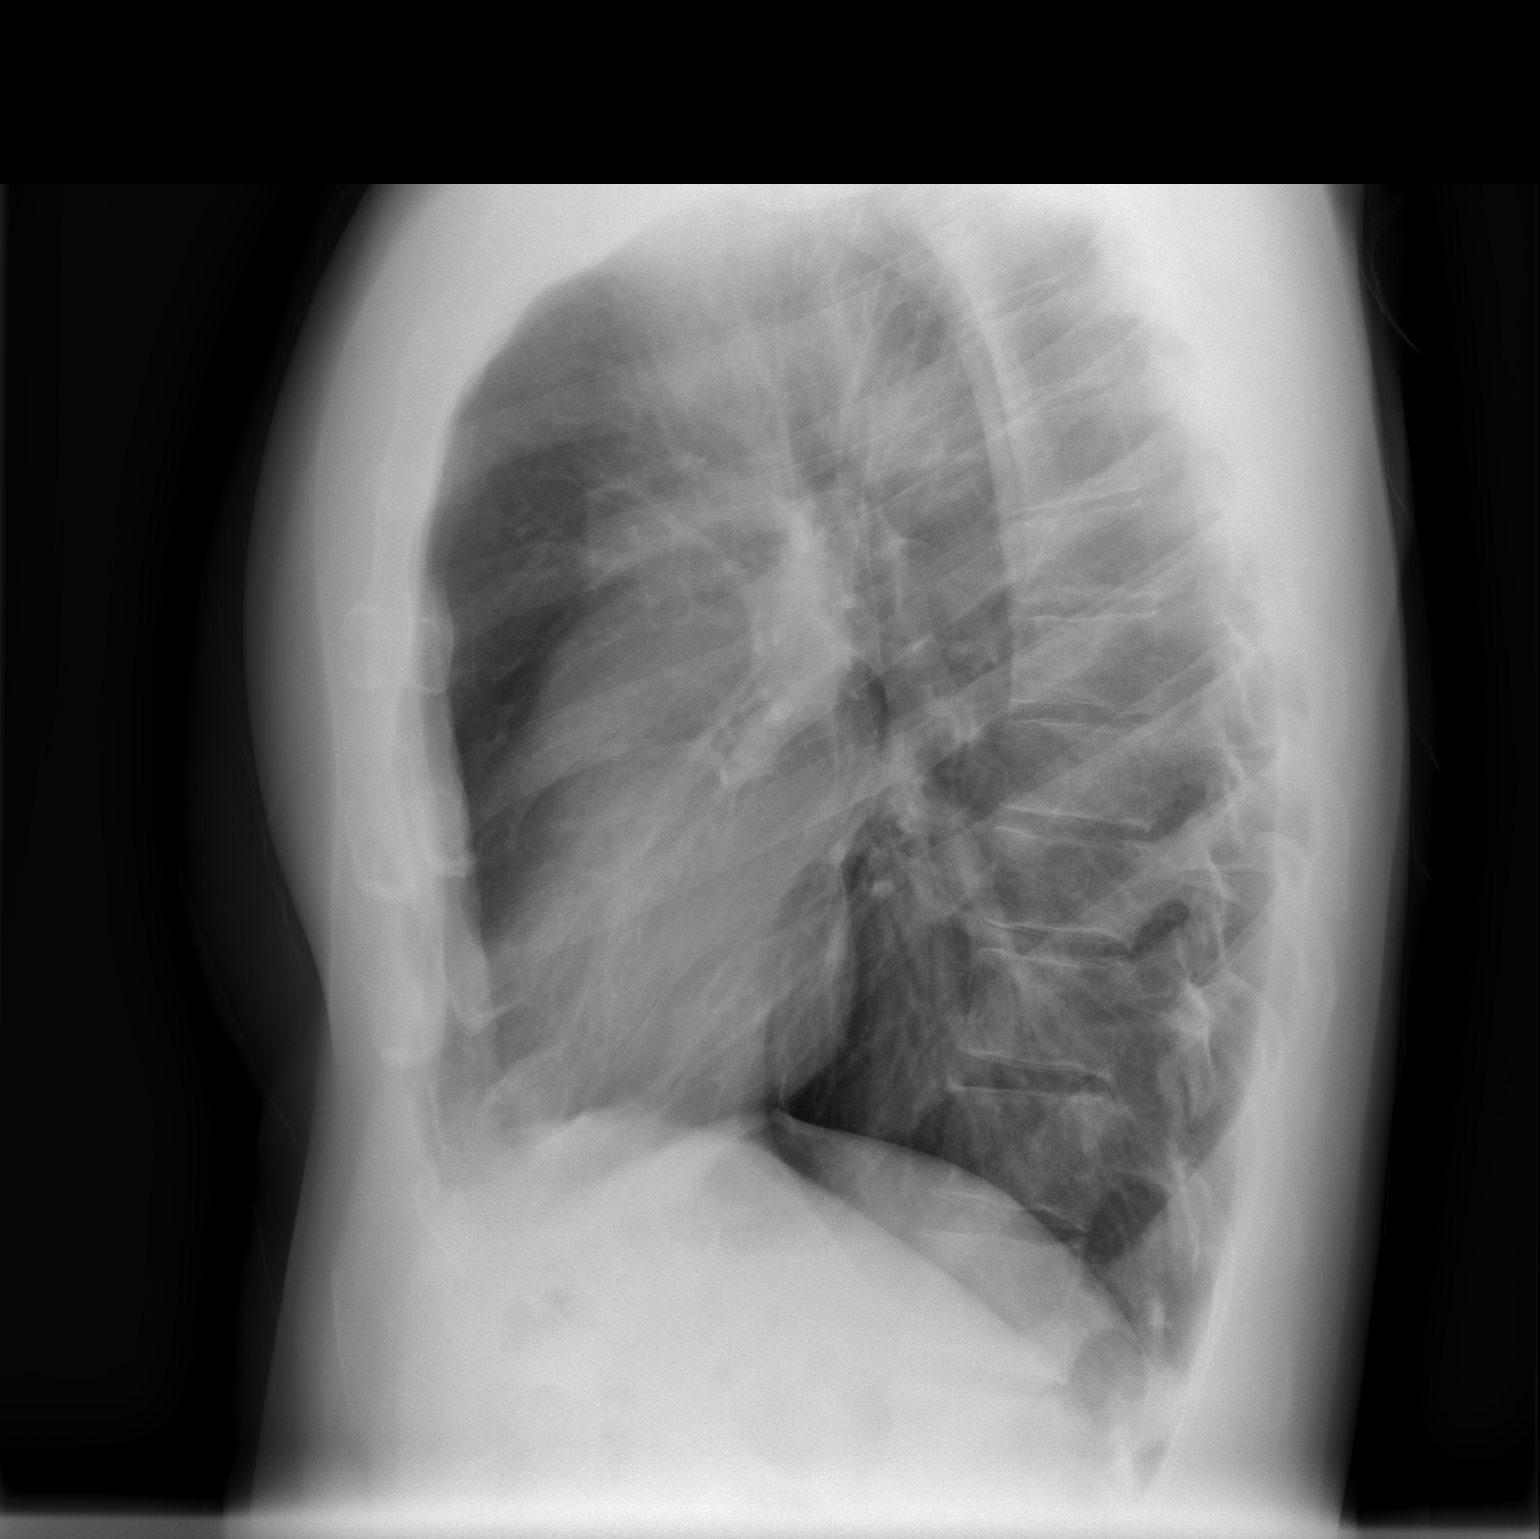

[2 of 2 positions shown; findings below may reference images not displayed]

FINDINGS: The heart size and mediastinal contours are within normal limits.
Both lungs are clear. The visualized skeletal structures are
unremarkable.
IMPRESSION: Normal chest x-ray.

## 2015-07-17 ENCOUNTER — Telehealth: Payer: Self-pay | Admitting: Family Medicine

## 2015-07-17 NOTE — Telephone Encounter (Signed)
Ok to re-establish 

## 2015-07-17 NOTE — Telephone Encounter (Signed)
Scheduled pt

## 2015-07-17 NOTE — Telephone Encounter (Signed)
Pt called for annual visit. Has not been seen since 05/19/12. Pt states he was dealing with cancer issues. I believe he stated his wife is a pt of Dr. Birdie Riddle. Please advise if we can re-establish pt.

## 2015-10-24 ENCOUNTER — Encounter: Payer: Self-pay | Admitting: Family Medicine

## 2015-11-25 ENCOUNTER — Telehealth: Payer: Self-pay

## 2015-11-26 ENCOUNTER — Ambulatory Visit (INDEPENDENT_AMBULATORY_CARE_PROVIDER_SITE_OTHER): Payer: BLUE CROSS/BLUE SHIELD | Admitting: Family Medicine

## 2015-11-26 ENCOUNTER — Encounter: Payer: Self-pay | Admitting: Family Medicine

## 2015-11-26 VITALS — BP 106/74 | HR 40 | Temp 98.3°F | Resp 16 | Ht 77.0 in | Wt 234.0 lb

## 2015-11-26 DIAGNOSIS — Z0000 Encounter for general adult medical examination without abnormal findings: Secondary | ICD-10-CM

## 2015-11-26 DIAGNOSIS — I4892 Unspecified atrial flutter: Secondary | ICD-10-CM

## 2015-11-26 NOTE — Patient Instructions (Signed)
Follow up in 1 year or as needed We'll notify you of your lab results and make any changes if needed Continue to work on healthy diet and regular exercise- you look great! Call with any questions or concerns If you want to join [place] at the new [place] office, any scheduled appointments will automatically transfer and we will see you at [address] [place] [address] [place], [place] [address] ([place] 12/09/15) Happy Holidays!! Enjoy your trip!!!

## 2015-11-26 NOTE — Progress Notes (Signed)
Pre visit review using our clinic review tool, if applicable. No additional management support is needed unless otherwise documented below in the visit note. 

## 2015-11-26 NOTE — Assessment & Plan Note (Signed)
Pt's PE WNL.  Declines flu and Tdap.  Check labs.  Anticipatory guidance provided.

## 2015-11-26 NOTE — Assessment & Plan Note (Signed)
Currently rate controlled and asymptomatic.  Following w/ Cards at [facility].  Will follow along and assist as able.

## 2015-11-26 NOTE — Progress Notes (Signed)
   Subjective:    Patient ID: Jose Mills, male    DOB: [birth year], 44 y.o.   MRN: [id number]  HPI CPE- no concerns today.   Review of Systems Patient reports no vision/hearing changes, anorexia, fever ,adenopathy, persistant/recurrent hoarseness, swallowing issues, chest pain, palpitations, edema, persistant/recurrent cough, hemoptysis, dyspnea (rest,exertional, paroxysmal nocturnal), gastrointestinal  bleeding (melena, rectal bleeding), abdominal pain, excessive heart burn, GU symptoms (dysuria, hematuria, voiding/incontinence issues) syncope, focal weakness, memory loss, numbness & tingling, skin/hair/nail changes, depression, anxiety, abnormal bruising/bleeding, musculoskeletal symptoms/signs.     Objective:   Physical Exam General Appearance:    Alert, cooperative, no distress, appears stated age  Head:    Normocephalic, without obvious abnormality, atraumatic  Eyes:    PERRL, conjunctiva/corneas clear, EOM's intact, fundi    benign, both eyes       Ears:    Normal TM's and external ear canals, both ears  Nose:   Nares normal, septum midline, mucosa normal, no drainage   or sinus tenderness  Throat:   Lips, mucosa, and tongue normal; teeth and gums normal  Neck:   Supple, symmetrical, trachea midline, no adenopathy;       thyroid:  No enlargement/tenderness/nodules  Back:     Symmetric, no curvature, ROM normal, no CVA tenderness  Lungs:     Clear to auscultation bilaterally, respirations unlabored  Chest wall:    No tenderness or deformity  Heart:    Regular rate and rhythm, S1 and S2 normal, no murmur, rub   or gallop  Abdomen:     Soft, non-tender, bowel sounds active all four quadrants,    no masses, no organomegaly  Genitalia:    Normal male without lesion, masses,discharge or tenderness, + varicocele on L (sees Urology at [facility])  Rectal:    Deferred due to young age  Extremities:   Extremities normal, atraumatic, no cyanosis or edema  Pulses:   2+ and  symmetric all extremities  Skin:   Skin color, texture, turgor normal, no rashes or lesions  Lymph nodes:   Cervical, supraclavicular, and axillary nodes normal  Neurologic:   CNII-XII intact. Normal strength, sensation and reflexes      throughout          Assessment & Plan:

## 2015-11-27 ENCOUNTER — Telehealth: Payer: Self-pay | Admitting: Family Medicine

## 2015-11-27 LAB — CBC WITH DIFFERENTIAL/PLATELET
BASOS PCT: 0.6 % (ref 0.0–3.0)
Basophils Absolute: 0 10*3/uL (ref 0.0–0.1)
EOS PCT: 1.1 % (ref 0.0–5.0)
Eosinophils Absolute: 0.1 10*3/uL (ref 0.0–0.7)
HCT: 48 % (ref 39.0–52.0)
Hemoglobin: 16 g/dL (ref 13.0–17.0)
LYMPHS ABS: 1.6 10*3/uL (ref 0.7–4.0)
Lymphocytes Relative: 31.2 % (ref 12.0–46.0)
MCHC: 33.4 g/dL (ref 30.0–36.0)
MCV: 97.8 fl (ref 78.0–100.0)
MONO ABS: 0.3 10*3/uL (ref 0.1–1.0)
MONOS PCT: 6.2 % (ref 3.0–12.0)
Neutro Abs: 3.2 10*3/uL (ref 1.4–7.7)
Neutrophils Relative %: 60.9 % (ref 43.0–77.0)
Platelets: 186 10*3/uL (ref 150.0–400.0)
RBC: 4.91 Mil/uL (ref 4.22–5.81)
RDW: 12.5 % (ref 11.5–15.5)
WBC: 5.3 10*3/uL (ref 4.0–10.5)

## 2015-11-27 LAB — LIPID PANEL
CHOL/HDL RATIO: 3
Cholesterol: 154 mg/dL (ref 0–200)
HDL: 53.3 mg/dL (ref >39.00)
LDL Cholesterol: 89 mg/dL (ref 0–99)
NONHDL: 100.33
Triglycerides: 56 mg/dL (ref 0.0–149.0)
VLDL: 11.2 mg/dL (ref 0.0–40.0)

## 2015-11-27 LAB — BASIC METABOLIC PANEL
BUN: 14 mg/dL (ref 6–23)
CHLORIDE: 103 meq/L (ref 96–112)
CO2: 34 mEq/L — ABNORMAL HIGH (ref 19–32)
Calcium: 8.9 mg/dL (ref 8.4–10.5)
Creatinine, Ser: 1.21 mg/dL (ref 0.40–1.50)
GFR: 69.88 mL/min (ref >60.00)
Glucose, Bld: 76 mg/dL (ref 70–99)
POTASSIUM: 4.4 meq/L (ref 3.5–5.1)
Sodium: 142 mEq/L (ref 135–145)

## 2015-11-27 LAB — HEPATIC FUNCTION PANEL
ALK PHOS: 47 U/L (ref 39–117)
ALT: 15 U/L (ref 0–53)
AST: 16 U/L (ref 0–37)
Albumin: 4 g/dL (ref 3.5–5.2)
BILIRUBIN TOTAL: 0.5 mg/dL (ref 0.2–1.2)
Bilirubin, Direct: 0.1 mg/dL (ref 0.0–0.3)
Total Protein: 6.7 g/dL (ref 6.0–8.3)

## 2015-11-27 LAB — TSH: TSH: 0.14 u[IU]/mL — AB (ref 0.35–4.50)

## 2015-11-27 NOTE — Addendum Note (Signed)
Addended by: Midge Minium on: 11/27/2015 01:05 PM   Modules accepted: Level of Service

## 2015-11-27 NOTE — Telephone Encounter (Signed)
Caller name: Self   Can be reached: [phone] (M)   Reason for call: Patient responding to My Chart message from Dr. Darene Lamer in ref to the dr he had seen to put him on levothyroxine (SYNTHROID, LEVOTHROID) 112 MCG tablet . It is Dr Forde Dandy with [facility].

## 2015-11-27 NOTE — Telephone Encounter (Signed)
Please fax lab results to Dr Forde Dandy

## 2015-11-28 NOTE — Telephone Encounter (Signed)
Labs faxed

## 2015-12-05 NOTE — Telephone Encounter (Signed)
Pre Visit call completed. 

## 2016-12-20 ENCOUNTER — Encounter: Payer: Self-pay | Admitting: Family Medicine

## 2017-04-25 DIAGNOSIS — E890 Postprocedural hypothyroidism: Secondary | ICD-10-CM | POA: Diagnosis not present

## 2017-05-30 DIAGNOSIS — R001 Bradycardia, unspecified: Secondary | ICD-10-CM | POA: Diagnosis not present

## 2017-05-30 DIAGNOSIS — I4892 Unspecified atrial flutter: Secondary | ICD-10-CM | POA: Diagnosis not present

## 2017-05-30 DIAGNOSIS — I480 Paroxysmal atrial fibrillation: Secondary | ICD-10-CM | POA: Diagnosis not present

## 2017-05-30 DIAGNOSIS — R002 Palpitations: Secondary | ICD-10-CM | POA: Diagnosis not present

## 2017-05-30 DIAGNOSIS — R9431 Abnormal electrocardiogram [ECG] [EKG]: Secondary | ICD-10-CM | POA: Diagnosis not present

## 2017-07-25 ENCOUNTER — Encounter: Payer: BLUE CROSS/BLUE SHIELD | Admitting: Family Medicine

## 2017-07-31 ENCOUNTER — Encounter: Payer: Self-pay | Admitting: Family Medicine

## 2017-09-06 ENCOUNTER — Encounter: Payer: Self-pay | Admitting: Endocrinology

## 2017-09-06 DIAGNOSIS — C73 Malignant neoplasm of thyroid gland: Secondary | ICD-10-CM | POA: Diagnosis not present

## 2017-09-06 DIAGNOSIS — Z6826 Body mass index (BMI) 26.0-26.9, adult: Secondary | ICD-10-CM | POA: Diagnosis not present

## 2017-09-06 DIAGNOSIS — Z1389 Encounter for screening for other disorder: Secondary | ICD-10-CM | POA: Diagnosis not present

## 2017-09-06 DIAGNOSIS — J020 Streptococcal pharyngitis: Secondary | ICD-10-CM | POA: Diagnosis not present

## 2017-09-06 DIAGNOSIS — E890 Postprocedural hypothyroidism: Secondary | ICD-10-CM | POA: Diagnosis not present

## 2018-09-12 DIAGNOSIS — E890 Postprocedural hypothyroidism: Secondary | ICD-10-CM | POA: Diagnosis not present

## 2018-09-12 DIAGNOSIS — Z6829 Body mass index (BMI) 29.0-29.9, adult: Secondary | ICD-10-CM | POA: Diagnosis not present

## 2018-09-12 DIAGNOSIS — C73 Malignant neoplasm of thyroid gland: Secondary | ICD-10-CM | POA: Diagnosis not present

## 2019-09-17 DIAGNOSIS — C73 Malignant neoplasm of thyroid gland: Secondary | ICD-10-CM | POA: Diagnosis not present

## 2019-09-17 DIAGNOSIS — E890 Postprocedural hypothyroidism: Secondary | ICD-10-CM | POA: Diagnosis not present

## 2019-09-20 DIAGNOSIS — C73 Malignant neoplasm of thyroid gland: Secondary | ICD-10-CM | POA: Diagnosis not present

## 2019-09-20 DIAGNOSIS — E890 Postprocedural hypothyroidism: Secondary | ICD-10-CM | POA: Diagnosis not present

## 2019-10-17 DIAGNOSIS — D235 Other benign neoplasm of skin of trunk: Secondary | ICD-10-CM | POA: Diagnosis not present

## 2019-10-17 DIAGNOSIS — B356 Tinea cruris: Secondary | ICD-10-CM | POA: Diagnosis not present

## 2019-10-17 DIAGNOSIS — D2362 Other benign neoplasm of skin of left upper limb, including shoulder: Secondary | ICD-10-CM | POA: Diagnosis not present

## 2020-02-08 ENCOUNTER — Telehealth: Payer: Self-pay | Admitting: Family Medicine

## 2020-02-08 NOTE — Telephone Encounter (Signed)
Ok to re-establish 

## 2020-02-08 NOTE — Telephone Encounter (Signed)
Please advise 

## 2020-02-08 NOTE — Telephone Encounter (Signed)
Pt called in asking to re-est with Dr. Birdie Riddle, please advise if this is ok?

## 2020-02-11 NOTE — Telephone Encounter (Signed)
LM making pt aware that he can re-est with Tabori.

## 2020-02-21 ENCOUNTER — Ambulatory Visit: Payer: BC Managed Care – PPO | Admitting: Family Medicine

## 2020-03-10 ENCOUNTER — Ambulatory Visit: Payer: BC Managed Care – PPO | Admitting: Family Medicine

## 2020-03-10 ENCOUNTER — Telehealth (INDEPENDENT_AMBULATORY_CARE_PROVIDER_SITE_OTHER): Payer: BC Managed Care – PPO | Admitting: Family Medicine

## 2020-03-10 ENCOUNTER — Encounter: Payer: Self-pay | Admitting: Family Medicine

## 2020-03-10 ENCOUNTER — Other Ambulatory Visit: Payer: Self-pay

## 2020-03-10 VITALS — Ht 76.0 in | Wt 235.0 lb

## 2020-03-10 DIAGNOSIS — E663 Overweight: Secondary | ICD-10-CM

## 2020-03-10 DIAGNOSIS — Z0000 Encounter for general adult medical examination without abnormal findings: Secondary | ICD-10-CM | POA: Diagnosis not present

## 2020-03-10 DIAGNOSIS — Z125 Encounter for screening for malignant neoplasm of prostate: Secondary | ICD-10-CM

## 2020-03-10 NOTE — Progress Notes (Signed)
I have discussed the procedure for the virtual visit with the patient who has given consent to proceed with assessment and treatment.    L , CMA     

## 2020-03-10 NOTE — Progress Notes (Signed)
   Virtual Visit via Video   I connected with patient on 03/10/20 at  2:30 PM EDT by a video enabled telemedicine application and verified that I am speaking with the correct person using two identifiers.  Location patient: Home Location provider: Acup[facility]urist, Office Persons participating in the virtual visit: Patient, Provider, Jose Mills (Jess B)  I discussed the limitations of evaluation and management by telemedicine and the availability of in person appointments. The patient expressed understanding and agreed to proceed.  Subjective:   HPI:   CPE- pt here today to re-establish.  Last seen 2016.  Following w/ Dr Forde Dandy for thyroid.  No concerns today.  Exercising regularly- running and lifting.  ROS:  Patient reports no vision/hearing changes, anorexia, fever ,adenopathy, persistant/recurrent hoarseness, swallowing issues, chest pain, palpitations, edema, persistant/recurrent cough, hemoptysis, dyspnea (rest,exertional, paroxysmal nocturnal), gastrointestinal  bleeding (melena, rectal bleeding), abdominal pain, excessive heart burn, GU symptoms (dysuria, hematuria, voiding/incontinence issues) syncope, focal weakness, memory loss, numbness & tingling, skin/hair/nail changes, depression, anxiety, abnormal bruising/bleeding, musculoskeletal symptoms/signs.   Patient Active Problem List   Diagnosis Date Noted  . Physical exam 11/26/2015  . Atrial flutter ([place]) 11/26/2015  . Neoplasm of uncertain behavior of thyroid gland 01/17/2014  . Cellulitis 05/19/2012  . Contact dermatitis due to plant 05/19/2012  . Allergy to animal dander 01/12/2012  . TINEA CRURIS 05/14/2010  . LIPOMA 02/21/2009  . Papillary thyroid carcinoma, follicular variant, multifocal 02/21/2009  . UNSPECIFIED DISORDER OF SKIN&SUBCUTANEOUS TISSUE 02/21/2009    Social History   Tobacco Use  . Smoking status: Never Smoker  . Smokeless tobacco: Never Used  Substance Use Topics  . Alcohol use: Yes    Comment:  OCCASIONAL    Current Outpatient Medications:  .  levothyroxine (SYNTHROID, LEVOTHROID) 112 MCG tablet, 224mg  6 days a week, and then 112 the remaining day., Disp: , Rfl:   No Known Allergies  Objective:   Ht 6\' 4"  (1.93 m)   Wt 235 lb (106.6 kg)   BMI 28.61 kg/m  AAOx3, NAD NCAT, EOMI No obvious CN deficits Coloring WNL Pt is able to speak clearly, coherently without shortness of breath or increased work of breathing.  Thought process is linear.  Mood is appropriate.   Assessment and Plan:   CPE- UTD on immunizations.  Limited video PE WNL.  Applauded his efforts at regular exercise and encouraged healthy diet.  Check labs.  Anticipatory guidance provided.    [name], MD 03/10/2020

## 2020-03-18 ENCOUNTER — Ambulatory Visit (INDEPENDENT_AMBULATORY_CARE_PROVIDER_SITE_OTHER): Payer: BC Managed Care – PPO

## 2020-03-18 ENCOUNTER — Other Ambulatory Visit: Payer: Self-pay

## 2020-03-18 DIAGNOSIS — Z125 Encounter for screening for malignant neoplasm of prostate: Secondary | ICD-10-CM | POA: Diagnosis not present

## 2020-03-18 DIAGNOSIS — E663 Overweight: Secondary | ICD-10-CM | POA: Diagnosis not present

## 2020-03-18 LAB — LIPID PANEL
Cholesterol: 185 mg/dL (ref 0–200)
HDL: 50.6 mg/dL (ref >39.00)
LDL Cholesterol: 111 mg/dL — ABNORMAL HIGH (ref 0–99)
NonHDL: 134.67
Total CHOL/HDL Ratio: 4
Triglycerides: 119 mg/dL (ref 0.0–149.0)
VLDL: 23.8 mg/dL (ref 0.0–40.0)

## 2020-03-18 LAB — HEPATIC FUNCTION PANEL
ALT: 19 U/L (ref 0–53)
AST: 21 U/L (ref 0–37)
Albumin: 4.4 g/dL (ref 3.5–5.2)
Alkaline Phosphatase: 50 U/L (ref 39–117)
Bilirubin, Direct: 0.1 mg/dL (ref 0.0–0.3)
Total Bilirubin: 0.5 mg/dL (ref 0.2–1.2)
Total Protein: 6.6 g/dL (ref 6.0–8.3)

## 2020-03-18 LAB — CBC WITH DIFFERENTIAL/PLATELET
Basophils Absolute: 0 10*3/uL (ref 0.0–0.1)
Basophils Relative: 0.9 % (ref 0.0–3.0)
Eosinophils Absolute: 0.1 10*3/uL (ref 0.0–0.7)
Eosinophils Relative: 2 % (ref 0.0–5.0)
HCT: 46.4 % (ref 39.0–52.0)
Hemoglobin: 16 g/dL (ref 13.0–17.0)
Lymphocytes Relative: 23.1 % (ref 12.0–46.0)
Lymphs Abs: 1 10*3/uL (ref 0.7–4.0)
MCHC: 34.4 g/dL (ref 30.0–36.0)
MCV: 97.3 fl (ref 78.0–100.0)
Monocytes Absolute: 0.3 10*3/uL (ref 0.1–1.0)
Monocytes Relative: 6.9 % (ref 3.0–12.0)
Neutro Abs: 2.9 10*3/uL (ref 1.4–7.7)
Neutrophils Relative %: 67.1 % (ref 43.0–77.0)
Platelets: 176 10*3/uL (ref 150.0–400.0)
RBC: 4.77 Mil/uL (ref 4.22–5.81)
RDW: 12.9 % (ref 11.5–15.5)
WBC: 4.4 10*3/uL (ref 4.0–10.5)

## 2020-03-18 LAB — BASIC METABOLIC PANEL
BUN: 13 mg/dL (ref 6–23)
CO2: 32 mEq/L (ref 19–32)
Calcium: 9.3 mg/dL (ref 8.4–10.5)
Chloride: 102 mEq/L (ref 96–112)
Creatinine, Ser: 0.99 mg/dL (ref 0.40–1.50)
GFR: 81.25 mL/min (ref >60.00)
Glucose, Bld: 86 mg/dL (ref 70–99)
Potassium: 4.8 mEq/L (ref 3.5–5.1)
Sodium: 141 mEq/L (ref 135–145)

## 2020-03-18 LAB — PSA: PSA: 0.51 ng/mL (ref 0.10–4.00)

## 2020-03-18 LAB — TSH: TSH: 1.82 u[IU]/mL (ref 0.35–4.50)

## 2020-03-18 NOTE — Progress Notes (Signed)
v

## 2020-03-19 ENCOUNTER — Encounter: Payer: Self-pay | Admitting: General Practice

## 2020-07-10 DIAGNOSIS — R509 Fever, unspecified: Secondary | ICD-10-CM | POA: Diagnosis not present

## 2020-07-10 DIAGNOSIS — J398 Other specified diseases of upper respiratory tract: Secondary | ICD-10-CM | POA: Diagnosis not present

## 2020-09-19 DIAGNOSIS — E890 Postprocedural hypothyroidism: Secondary | ICD-10-CM | POA: Diagnosis not present

## 2020-09-19 DIAGNOSIS — C73 Malignant neoplasm of thyroid gland: Secondary | ICD-10-CM | POA: Diagnosis not present

## 2020-10-09 DIAGNOSIS — Z20822 Contact with and (suspected) exposure to covid-19: Secondary | ICD-10-CM | POA: Diagnosis not present

## 2021-03-16 DIAGNOSIS — X32XXXS Exposure to sunlight, sequela: Secondary | ICD-10-CM | POA: Diagnosis not present

## 2021-03-16 DIAGNOSIS — D2362 Other benign neoplasm of skin of left upper limb, including shoulder: Secondary | ICD-10-CM | POA: Diagnosis not present

## 2021-03-16 DIAGNOSIS — D235 Other benign neoplasm of skin of trunk: Secondary | ICD-10-CM | POA: Diagnosis not present

## 2021-03-16 DIAGNOSIS — L814 Other melanin hyperpigmentation: Secondary | ICD-10-CM | POA: Diagnosis not present

## 2021-06-03 ENCOUNTER — Encounter: Payer: Self-pay | Admitting: *Deleted

## 2021-10-26 DIAGNOSIS — E890 Postprocedural hypothyroidism: Secondary | ICD-10-CM | POA: Diagnosis not present

## 2021-10-26 DIAGNOSIS — C73 Malignant neoplasm of thyroid gland: Secondary | ICD-10-CM | POA: Diagnosis not present

## 2021-12-18 ENCOUNTER — Ambulatory Visit (INDEPENDENT_AMBULATORY_CARE_PROVIDER_SITE_OTHER): Payer: BC Managed Care – PPO | Admitting: Sports Medicine

## 2021-12-18 VITALS — BP 114/80 | Ht 76.0 in | Wt 227.0 lb

## 2021-12-18 DIAGNOSIS — M7631 Iliotibial band syndrome, right leg: Secondary | ICD-10-CM

## 2021-12-18 DIAGNOSIS — M25561 Pain in right knee: Secondary | ICD-10-CM | POA: Diagnosis not present

## 2021-12-18 DIAGNOSIS — M25551 Pain in right hip: Secondary | ICD-10-CM

## 2021-12-18 NOTE — Progress Notes (Signed)
PCP: Midge Minium, MD  Subjective:   HPI: Patient is a 51 y.o. male here for evaluation of right lateral hip pain and right knee pain. Patient was referred by Dr. Birdie Riddle.  Right hip pain - Patient reports that he has had right lateral hip pain for few years now.  He does like to run for exercise, usually runs about 2-3 miles during the week, and a longer 6-10 mile run on the weekend.  Hip pain does not bother him enough to limit his activity when he runs, but he will be more sore following this.  Is a dull achy pain, but can be sharper with activity.  He denies any specific injury.  He is not taking any medications for the pain.  He does note that he is fairly tight and needs to stretch more.  Right knee pain -the right knee has been more of a subacute problem, bothering him for a few months.  The pain is located more so over the anterior aspect of the knee.  He denies any swelling, erythema or ecchymosis.  Denies any known injury to this.  His pain will be worse more so when he is stationary after prolonged sitting.  He has noticed the pain more recently when he sits in a car for a long period of time.  He does get some pain when he first gets up from a seated position as well.  He denies any instability or giving out of the knee.  He is not taking any medications for this.  Past Medical History:  Diagnosis Date   Headache(784.0)    HX MIGRAINES   PONV (postoperative nausea and vomiting)    Thyroid ca [facility])     Current Outpatient Medications on File Prior to Visit  Medication Sig Dispense Refill   levothyroxine (SYNTHROID, LEVOTHROID) 112 MCG tablet 224mg  6 days a week, and then 112 the remaining day.     No current facility-administered medications on file prior to visit.    Past Surgical History:  Procedure Laterality Date   APPENDECTOMY     THYROID LOBECTOMY Right 01/17/2014   Procedure: TOTAL THYROIDECTOMY WITH LIMITED LYMPH NODE DISSECTION ;  Surgeon: Earnstine Regal, MD;   Location: [facility];  Service: General;  Laterality: Right;   TYMPANOSTOMY TUBE PLACEMENT      No Known Allergies  BP 114/80    Ht 6\' 4"  (1.93 m)    Wt 227 lb (103 kg)    BMI 27.63 kg/m   [place] Adult Exercise 12/18/2021  Frequency of aerobic exercise (# of days/week) 3  Average time in minutes 30  Frequency of strengthening activities (# of days/week) 3    No flowsheet data found.      Objective:  Physical Exam:  Gen: Well-appearing, in no acute distress; non-toxic CV: Regular Rate. Well-perfused. Warm.  Resp: Breathing unlabored on room air; no wheezing. Psych: Fluid speech in conversation; appropriate affect; normal thought process Neuro: Sensation intact throughout. No gross coordination deficits.  MSK:  - Right hip: + TTP noted over the greater trochanter.  Inspection is no erythema, ecchymosis or swelling.  Passive logroll negative.  Patient mobility is somewhat limited secondary to tight hamstring muscles.  The patient has diminished strength with lateral abduction of the right hip compared to the contralateral hip.  Otherwise 5/5 strength in all directions.  No TTP over the piriformis.  Negative FABER, FADIR test.  During gait analysis, he does have a mildly positive Trendelenburg leaning  to the contralateral side upon swing phase.  There is positive TTP with palpation down the IT band from the greater trochanter to the mid lateral thigh.  -Right knee: No significant TTP noted throughout the knee.  There is full range of motion from 0-135 degrees.  No varus or valgus instability.  Negative anterior/posterior drawer, negative Lachman.  No pain with manipulation of the patella. + Positive Nobles compression test with clicking as the IT band transverses over the lateral aspect of the knee onto Gerdy's tubercle, there is no significant pain associated with this.  Neurovascular intact distally    Assessment & Plan:  1. Greater trochanteric pain syndrome, right -with  associated weakness with right-sided hip abduction.  Likely insufficiency of gluteus medius/minimus tendons.  Associated mild Trendelenburg gait. 2. IT Band syndrome, right 3. Right knee pain, subacute -structurally his knee is sound.  He does have a associated click with Nobles compression test is IT band courses over the lateral knee.  I feel like this is a sequelae of his hip weakness, and not a true issue with the knee joint itself.  - HEP series for the hip abductor muscles and hip stabilization exercises shown and demonstrated -Discussed he may use a foam roller to roll out the IT band as well -Work on stretching for his hamstrings, SI, and associated pelvic stabilizing muscles -May use topical Voltaren gel, Arnica gel, IcyHot over the IT band or knee; OTC anti-inflammatories PRN -We will see how he responds to the strengthening exercises for the hip abductors, and see him back in the next 4-5 weeks.  He may call or return sooner if any issues arise.  Elba Barman, DO PGY-4, Sports Medicine Fellow [place]  Addendum:  I was the preceptor for this visit and available for immediate consultation.  Karlton Lemon MD Kirt Boys

## 2022-09-20 DIAGNOSIS — L814 Other melanin hyperpigmentation: Secondary | ICD-10-CM | POA: Diagnosis not present

## 2022-09-20 DIAGNOSIS — X32XXXS Exposure to sunlight, sequela: Secondary | ICD-10-CM | POA: Diagnosis not present

## 2022-09-20 DIAGNOSIS — D235 Other benign neoplasm of skin of trunk: Secondary | ICD-10-CM | POA: Diagnosis not present

## 2022-09-20 DIAGNOSIS — D2362 Other benign neoplasm of skin of left upper limb, including shoulder: Secondary | ICD-10-CM | POA: Diagnosis not present

## 2022-10-25 DIAGNOSIS — E890 Postprocedural hypothyroidism: Secondary | ICD-10-CM | POA: Diagnosis not present

## 2022-10-25 DIAGNOSIS — C73 Malignant neoplasm of thyroid gland: Secondary | ICD-10-CM | POA: Diagnosis not present

## 2022-11-05 ENCOUNTER — Encounter: Payer: Self-pay | Admitting: Family Medicine

## 2023-02-08 ENCOUNTER — Encounter: Payer: Self-pay | Admitting: Gastroenterology

## 2023-02-21 DIAGNOSIS — R051 Acute cough: Secondary | ICD-10-CM | POA: Diagnosis not present

## 2023-02-21 DIAGNOSIS — J019 Acute sinusitis, unspecified: Secondary | ICD-10-CM | POA: Diagnosis not present

## 2023-04-14 DIAGNOSIS — D485 Neoplasm of uncertain behavior of skin: Secondary | ICD-10-CM | POA: Diagnosis not present

## 2023-04-14 DIAGNOSIS — C44319 Basal cell carcinoma of skin of other parts of face: Secondary | ICD-10-CM | POA: Diagnosis not present

## 2023-04-17 DIAGNOSIS — C4491 Basal cell carcinoma of skin, unspecified: Secondary | ICD-10-CM | POA: Insufficient documentation

## 2023-04-22 NOTE — Progress Notes (Signed)
Jose Mills    [id number]    [birth year]  Primary Care Physician:Tabori, Helane Rima, MD  Referring Physician: Sheliah Hatch, MD [address] [place],  [place] [address]   Chief complaint: No chief complaint on file.   HPI: Jose Mills is a 52 y.o. male presenting to clinic today for consult for a screening colonoscopy and EGD.  Today,   GI Hx:  CT Abdomen Pelvis w contrast 08-09-14 No findings to explain the patient's history of bilateral  varicoceles. Specifically, no pelvic sidewall abnormality or  abnormality within the pelvic venous anatomy.     Current Outpatient Medications:    levothyroxine (SYNTHROID, LEVOTHROID) 112 MCG tablet, 224mg  6 days a week, and then 112 the remaining day., Disp: , Rfl:    Allergies as of 04/25/2023   (No Known Allergies)    Past Medical History:  Diagnosis Date   Headache(784.0)    HX MIGRAINES   PONV (postoperative nausea and vomiting)    Thyroid ca [facility])     Past Surgical History:  Procedure Laterality Date   APPENDECTOMY     THYROID LOBECTOMY Right 01/17/2014   Procedure: TOTAL THYROIDECTOMY WITH LIMITED LYMPH NODE DISSECTION ;  Surgeon: Velora Heckler, MD;  Location: [facility];  Service: General;  Laterality: Right;   TYMPANOSTOMY TUBE PLACEMENT      No family history on file.  Social History   Socioeconomic History   Marital status: Married    Spouse name: Not on file   Number of children: Not on file   Years of education: Not on file   Highest education level: Not on file  Occupational History   Not on file  Tobacco Use   Smoking status: Never   Smokeless tobacco: Never  Vaping Use   Vaping Use: Never used  Substance and Sexual Activity   Alcohol use: Yes    Comment: OCCASIONAL   Drug use: No   Sexual activity: Not on file  Other Topics Concern   Not on file  Social History Narrative   Not on file   Social Determinants of Health   Financial Resource  Strain: Not on file  Food Insecurity: Not on file  Transportation Needs: Not on file  Physical Activity: Not on file  Stress: Not on file  Social Connections: Not on file  Intimate Partner Violence: Not on file      Review of systems: Review of Systems    Physical Exam: There were no vitals filed for this visit. There is no height or weight on file to calculate BMI.  General: well-appearing ***  Eyes: sclera anicteric, no redness ENT: oral mucosa moist without lesions, no cervical or supraclavicular lymphadenopathy CV: RRR, no JVD, no peripheral edema Resp: clear to auscultation bilaterally, normal RR and effort noted GI: soft, no tenderness, with active bowel sounds. No guarding or palpable organomegaly noted. Skin; warm and dry, no rash or jaundice noted Neuro: awake, alert and oriented x 3. Normal gross motor function and fluent speech   Data Reviewed:  Reviewed labs, radiology imaging, old records and pertinent past GI work up   Assessment and Plan/Recommendations:  ***  This visit required *** minutes of patient care (this includes precharting, chart review, review of results, face-to-face time used for counseling as well as treatment plan and follow-up. The patient was provided an opportunity to ask questions and all were answered. The patient agreed with the  plan and demonstrated an understanding of the instructions.  Iona Beard , MD  CC: Sheliah Hatch, MD   Ladona Mow Hewitt Shorts as a scribe for Marsa Aris, MD.,have documented all relevant documentation on the behalf of Marsa Aris, MD,as directed by  Marsa Aris, MD while in the presence of Marsa Aris, MD.   I, Marsa Aris, MD, have reviewed all documentation for this visit. The documentation on 04/22/23 for the exam, diagnosis, procedures, and orders are all accurate and complete.

## 2023-04-25 ENCOUNTER — Encounter: Payer: Self-pay | Admitting: Gastroenterology

## 2023-04-25 ENCOUNTER — Ambulatory Visit (INDEPENDENT_AMBULATORY_CARE_PROVIDER_SITE_OTHER): Payer: BC Managed Care – PPO | Admitting: Gastroenterology

## 2023-04-25 VITALS — BP 110/78 | HR 56 | Ht 76.5 in | Wt 229.5 lb

## 2023-04-25 DIAGNOSIS — Z1211 Encounter for screening for malignant neoplasm of colon: Secondary | ICD-10-CM | POA: Diagnosis not present

## 2023-04-25 NOTE — Patient Instructions (Signed)
You have been scheduled for a colonoscopy. Please follow written instructions given to you at your visit today.  Please pick up your prep supplies at the pharmacy within the next 1-3 days. If you use inhalers (even only as needed), please bring them with you on the day of your procedure.   Due to recent changes in healthcare laws, you may see the results of your imaging and laboratory studies on MyChart before your provider has had a chance to review them.  We understand that in some cases there may be results that are confusing or concerning to you. Not all laboratory results come back in the same time frame and the provider may be waiting for multiple results in order to interpret others.  Please give us 48 hours in order for your provider to thoroughly review all the results before contacting the office for clarification of your results.   I appreciate the  opportunity to care for you  Thank You   Kavitha Nandigam , MD  

## 2023-04-26 DIAGNOSIS — C44319 Basal cell carcinoma of skin of other parts of face: Secondary | ICD-10-CM | POA: Diagnosis not present

## 2023-04-28 ENCOUNTER — Ambulatory Visit (AMBULATORY_SURGERY_CENTER): Payer: BC Managed Care – PPO | Admitting: Gastroenterology

## 2023-04-28 ENCOUNTER — Encounter: Payer: Self-pay | Admitting: Gastroenterology

## 2023-04-28 VITALS — BP 103/62 | HR 58 | Temp 98.4°F | Resp 10 | Ht 76.0 in | Wt 229.0 lb

## 2023-04-28 DIAGNOSIS — Z1211 Encounter for screening for malignant neoplasm of colon: Secondary | ICD-10-CM | POA: Diagnosis not present

## 2023-04-28 MED ORDER — SODIUM CHLORIDE 0.9 % IV SOLN: 500.0000 mL | Freq: Once | INTRAVENOUS | Status: DC

## 2023-04-28 NOTE — Progress Notes (Signed)
Pt resting comfortably. VSS. Airway intact. SBAR complete to RN. All questions answered.   

## 2023-04-28 NOTE — Progress Notes (Signed)
Please refer to office visit note 04/25/23. No additional changes in H&P Patient is appropriate for planned procedure(s) and anesthesia in an ambulatory setting  K. Scherry Ran , MD [phone]

## 2023-04-28 NOTE — Patient Instructions (Signed)
YOU HAD AN ENDOSCOPIC PROCEDURE TODAY AT THE [facility]:   Refer to the procedure report that was given to you for any specific questions about what was found during the examination.  If the procedure report does not answer your questions, please call your gastroenterologist to clarify.  If you requested that your care partner not be given the details of your procedure findings, then the procedure report has been included in a sealed envelope for you to review at your convenience later.  **Handout given on Hemorrhoids**  YOU SHOULD EXPECT: Some feelings of bloating in the abdomen. Passage of more gas than usual.  Walking can help get rid of the air that was put into your GI tract during the procedure and reduce the bloating. If you had a lower endoscopy (such as a colonoscopy or flexible sigmoidoscopy) you may notice spotting of blood in your stool or on the toilet paper. If you underwent a bowel prep for your procedure, you may not have a normal bowel movement for a few days.  Please Note:  You might notice some irritation and congestion in your nose or some drainage.  This is from the oxygen used during your procedure.  There is no need for concern and it should clear up in a day or so.  SYMPTOMS TO REPORT IMMEDIATELY:  Following lower endoscopy (colonoscopy or flexible sigmoidoscopy):  Excessive amounts of blood in the stool  Significant tenderness or worsening of abdominal pains  Swelling of the abdomen that is new, acute  Fever of 100F or higher  For urgent or emergent issues, a gastroenterologist can be reached at any hour by calling [phone]. Do not use MyChart messaging for urgent concerns.    DIET:  We do recommend a small meal at first, but then you may proceed to your regular diet.  Drink plenty of fluids but you should avoid alcoholic beverages for 24 hours.  ACTIVITY:  You should plan to take it easy for the rest of today and you should NOT DRIVE or use heavy  machinery until tomorrow (because of the sedation medicines used during the test).    FOLLOW UP: Our staff will call the number listed on your records the next business day following your procedure.  We will call around 7:15- 8:00 am to check on you and address any questions or concerns that you may have regarding the information given to you following your procedure. If we do not reach you, we will leave a message.     If any biopsies were taken you will be contacted by phone or by letter within the next 1-3 weeks.  Please call us at [phone] if you have not heard about the biopsies in 3 weeks.    SIGNATURES/CONFIDENTIALITY: You and/or your care partner have signed paperwork which will be entered into your electronic medical record.  These signatures attest to the fact that that the information above on your After Visit Summary has been reviewed and is understood.  Full responsibility of the confidentiality of this discharge information lies with you and/or your care-partner. 

## 2023-04-28 NOTE — Op Note (Signed)
[facility] Patient Name: Jose Mills Procedure Date: 04/28/2023 10:10 AM MRN: [id number] Endoscopist: Napoleon Form , MD, [id number] Age: 52 Referring MD:  Date of Birth: 06/20/1971 Gender: Male Account #: [id number] Procedure:                Colonoscopy Indications:              Screening for colorectal malignant neoplasm Medicines:                Monitored Anesthesia Care Procedure:                Pre-Anesthesia Assessment:                           - Prior to the procedure, a History and Physical                            was performed, and patient medications and                            allergies were reviewed. The patient's tolerance of                            previous anesthesia was also reviewed. The risks                            and benefits of the procedure and the sedation                            options and risks were discussed with the patient.                            All questions were answered, and informed consent                            was obtained. Prior Anticoagulants: The patient has                            taken no anticoagulant or antiplatelet agents. ASA                            Grade Assessment: II - A patient with mild systemic                            disease. After reviewing the risks and benefits,                            the patient was deemed in satisfactory condition to                            undergo the procedure.                           After obtaining informed consent, the colonoscope  was passed under direct vision. Throughout the                            procedure, the patient's blood pressure, pulse, and                            oxygen saturations were monitored continuously. The                            Olympus PCF-H190DL ([id number]) Colonoscope was                            introduced through the anus and advanced to the the                             cecum, identified by appendiceal orifice and                            ileocecal valve. The colonoscopy was performed                            without difficulty. The patient tolerated the                            procedure well. The quality of the bowel                            preparation was good. The ileocecal valve,                            appendiceal orifice, and rectum were photographed. Scope In: 10:17:41 AM Scope Out: 10:32:53 AM Scope Withdrawal Time: 0 hours 10 minutes 41 seconds  Total Procedure Duration: 0 hours 15 minutes 12 seconds  Findings:                 The perianal and digital rectal examinations were                            normal.                           Non-bleeding internal hemorrhoids were found during                            retroflexion. The hemorrhoids were small.                           The exam was otherwise without abnormality. Complications:            No immediate complications. Estimated Blood Loss:     Estimated blood loss was minimal. Impression:               - Non-bleeding internal hemorrhoids.                           - The examination was otherwise normal.                           -  No specimens collected. Recommendation:           - Patient has a contact number available for                            emergencies. The signs and symptoms of potential                            delayed complications were discussed with the                            patient. Return to normal activities tomorrow.                            Written discharge instructions were provided to the                            patient.                           - Resume previous diet.                           - Continue present medications.                           - Repeat colonoscopy in 10 years for surveillance. Napoleon Form, MD 04/28/2023 10:41:41 AM This report has been signed electronically.

## 2023-04-29 ENCOUNTER — Telehealth: Payer: Self-pay

## 2023-04-29 NOTE — Telephone Encounter (Signed)
  Follow up Call-     04/28/2023    9:08 AM  Call back number  Post procedure Call Back phone  # [phone]  Permission to leave phone message Yes     Patient questions:  Do you have a fever, pain , or abdominal swelling? No. Pain Score  0 *  Have you tolerated food without any problems? Yes.    Have you been able to return to your normal activities? Yes.    Do you have any questions about your discharge instructions: Diet   No. Medications  No. Follow up visit  No.  Do you have questions or concerns about your Care? No.  Actions: * If pain score is 4 or above: No action needed, pain <4.

## 2023-05-03 DIAGNOSIS — C44319 Basal cell carcinoma of skin of other parts of face: Secondary | ICD-10-CM | POA: Diagnosis not present

## 2023-05-13 DIAGNOSIS — L905 Scar conditions and fibrosis of skin: Secondary | ICD-10-CM | POA: Diagnosis not present

## 2023-07-01 ENCOUNTER — Encounter: Payer: Self-pay | Admitting: Family Medicine

## 2023-07-15 ENCOUNTER — Ambulatory Visit (INDEPENDENT_AMBULATORY_CARE_PROVIDER_SITE_OTHER): Payer: BC Managed Care – PPO | Admitting: Family Medicine

## 2023-07-15 ENCOUNTER — Encounter: Payer: Self-pay | Admitting: Family Medicine

## 2023-07-15 VITALS — BP 116/70 | HR 57 | Temp 98.2°F | Ht 76.0 in | Wt 217.8 lb

## 2023-07-15 DIAGNOSIS — C73 Malignant neoplasm of thyroid gland: Secondary | ICD-10-CM

## 2023-07-15 DIAGNOSIS — E039 Hypothyroidism, unspecified: Secondary | ICD-10-CM | POA: Insufficient documentation

## 2023-07-15 DIAGNOSIS — Z13220 Encounter for screening for lipoid disorders: Secondary | ICD-10-CM | POA: Diagnosis not present

## 2023-07-15 DIAGNOSIS — Z125 Encounter for screening for malignant neoplasm of prostate: Secondary | ICD-10-CM

## 2023-07-15 DIAGNOSIS — Z0000 Encounter for general adult medical examination without abnormal findings: Secondary | ICD-10-CM | POA: Diagnosis not present

## 2023-07-15 LAB — BASIC METABOLIC PANEL
BUN: 19 mg/dL (ref 6–23)
CO2: 32 mEq/L (ref 19–32)
Calcium: 9 mg/dL (ref 8.4–10.5)
Chloride: 101 mEq/L (ref 96–112)
Creatinine, Ser: 1.03 mg/dL (ref 0.40–1.50)
GFR: 85.19 mL/min (ref >60.00)
Glucose, Bld: 78 mg/dL (ref 70–99)
Potassium: 4.7 mEq/L (ref 3.5–5.1)
Sodium: 140 mEq/L (ref 135–145)

## 2023-07-15 LAB — LIPID PANEL
Cholesterol: 204 mg/dL — ABNORMAL HIGH (ref 0–200)
HDL: 61.3 mg/dL (ref >39.00)
LDL Cholesterol: 124 mg/dL — ABNORMAL HIGH (ref 0–99)
NonHDL: 142.82
Total CHOL/HDL Ratio: 3
Triglycerides: 94 mg/dL (ref 0.0–149.0)
VLDL: 18.8 mg/dL (ref 0.0–40.0)

## 2023-07-15 LAB — CBC WITH DIFFERENTIAL/PLATELET
Basophils Absolute: 0 10*3/uL (ref 0.0–0.1)
Basophils Relative: 0.7 % (ref 0.0–3.0)
Eosinophils Absolute: 0 10*3/uL (ref 0.0–0.7)
Eosinophils Relative: 0.8 % (ref 0.0–5.0)
HCT: 47.1 % (ref 39.0–52.0)
Hemoglobin: 15.5 g/dL (ref 13.0–17.0)
Lymphocytes Relative: 17 % (ref 12.0–46.0)
Lymphs Abs: 1 10*3/uL (ref 0.7–4.0)
MCHC: 32.9 g/dL (ref 30.0–36.0)
MCV: 98.7 fl (ref 78.0–100.0)
Monocytes Absolute: 0.3 10*3/uL (ref 0.1–1.0)
Monocytes Relative: 5.7 % (ref 3.0–12.0)
Neutro Abs: 4.5 10*3/uL (ref 1.4–7.7)
Neutrophils Relative %: 75.8 % (ref 43.0–77.0)
Platelets: 177 10*3/uL (ref 150.0–400.0)
RBC: 4.77 Mil/uL (ref 4.22–5.81)
RDW: 13.1 % (ref 11.5–15.5)
WBC: 5.9 10*3/uL (ref 4.0–10.5)

## 2023-07-15 LAB — PSA: PSA: 0.56 ng/mL (ref 0.10–4.00)

## 2023-07-15 LAB — HEPATIC FUNCTION PANEL
ALT: 22 U/L (ref 0–53)
AST: 19 U/L (ref 0–37)
Albumin: 4.3 g/dL (ref 3.5–5.2)
Alkaline Phosphatase: 40 U/L (ref 39–117)
Bilirubin, Direct: 0.1 mg/dL (ref 0.0–0.3)
Total Bilirubin: 0.5 mg/dL (ref 0.2–1.2)
Total Protein: 6.6 g/dL (ref 6.0–8.3)

## 2023-07-15 LAB — TSH: TSH: 0.36 u[IU]/mL (ref 0.35–5.50)

## 2023-07-15 LAB — HIGH SENSITIVITY CRP: CRP, High Sensitivity: 0.8 mg/L (ref 0.000–5.000)

## 2023-07-15 NOTE — Assessment & Plan Note (Signed)
Pt's PE WNL.  UTD on colonoscopy, Tdap.  Declines HIV and Hep C screening.  Since pt has had both thyroid cancer and skin cancer, he is interested in genetic testing.  Referral placed.  Check labs.  Anticipatory guidance provided.

## 2023-07-15 NOTE — Patient Instructions (Signed)
Follow up in 1 year or as needed We'll notify you of your lab results and make any changes if needed Keep up the good work on healthy diet and regular exercise- you look great! We'll call you to schedule your genetic testing Call with any questions or concerns Enjoy the rest of your summer!!!

## 2023-07-15 NOTE — Progress Notes (Signed)
   Subjective:    Patient ID: Jose Mills, male    DOB: Sep 06, 1971, 52 y.o.   MRN: [id number]  HPI CPE- UTD on Tdap, colonoscopy  Patient Care Team    Relationship Specialty Notifications Start End  Sheliah Hatch, MD PCP - General   11/18/10   Rytlewski, Zorita Pang, MD Referring Physician Cardiology  11/26/15   Adrian Prince, MD Consulting Physician Endocrinology  11/28/15      Health Maintenance  Topic Date Due   HIV Screening  Never done   Hepatitis C Screening  Never done   COVID-19 Vaccine (3 - Pfizer risk series) 04/10/2020   INFLUENZA VACCINE  07/07/2023   DTaP/Tdap/Td (2 - Td or Tdap) 12/24/2026   Colonoscopy  04/27/2033   HPV VACCINES  Aged Out      Review of Systems Patient reports no vision/hearing changes, anorexia, fever ,adenopathy, persistant/recurrent hoarseness, swallowing issues, chest pain, edema, persistant/recurrent cough, hemoptysis, dyspnea (rest,exertional, paroxysmal nocturnal), gastrointestinal  bleeding (melena, rectal bleeding), abdominal pain, excessive heart burn, GU symptoms (dysuria, hematuria, voiding/incontinence issues) syncope, focal weakness, memory loss, numbness & tingling, hair/nail changes, depression, anxiety, abnormal bruising/bleeding, musculoskeletal symptoms/signs.   + 11 lb weight loss- pt has been on a diet + skin cancer- L forehead + palpitations- known paroxysmal Aflutter    Objective:   Physical Exam General Appearance:    Alert, cooperative, no distress, appears stated age  Head:    Normocephalic, without obvious abnormality, atraumatic  Eyes:    PERRL, conjunctiva/corneas clear, EOM's intact both eyes       Ears:    Normal TM's and external ear canals, both ears  Nose:   Nares normal, septum midline, mucosa normal, no drainage   or sinus tenderness  Throat:   Lips, mucosa, and tongue normal; teeth and gums normal  Neck:   Supple, symmetrical, trachea midline, no adenopathy;       thyroid:  No  enlargement/tenderness/nodules  Back:     Symmetric, no curvature, ROM normal, no CVA tenderness  Lungs:     Clear to auscultation bilaterally, respirations unlabored  Chest wall:    No tenderness or deformity  Heart:    Regular rate and rhythm, S1 and S2 normal, no murmur, rub   or gallop  Abdomen:     Soft, non-tender, bowel sounds active all four quadrants,    no masses, no organomegaly  Genitalia:    deferred  Rectal:    Extremities:   Extremities normal, atraumatic, no cyanosis or edema  Pulses:   2+ and symmetric all extremities  Skin:   Skin color, texture, turgor normal, no rashes or lesions  Lymph nodes:   Cervical, supraclavicular, and axillary nodes normal  Neurologic:   CNII-XII intact. Normal strength, sensation and reflexes      throughout          Assessment & Plan:

## 2023-07-18 ENCOUNTER — Telehealth: Payer: Self-pay

## 2023-07-18 NOTE — Telephone Encounter (Signed)
Pt seen results Via my chart  

## 2023-07-18 NOTE — Telephone Encounter (Signed)
-----   Message from Neena Rhymes sent at 07/18/2023  7:28 AM EDT ----- Labs are all normal and look great!  Your LDL (bad cholesterol) is mildly elevated but your HDL (good cholesterol) is fantastic and the ration of bad to good is wonderful.  No changes at this time

## 2023-07-20 ENCOUNTER — Telehealth: Payer: Self-pay | Admitting: Licensed Clinical Social Worker

## 2023-07-20 NOTE — Telephone Encounter (Signed)
Left patient a message regarding scheduled appointment times/dates for [facility]

## 2023-08-22 ENCOUNTER — Encounter: Payer: Self-pay | Admitting: Genetic Counselor

## 2023-08-22 ENCOUNTER — Inpatient Hospital Stay: Payer: BC Managed Care – PPO | Attending: Genetic Counselor | Admitting: Genetic Counselor

## 2023-08-22 ENCOUNTER — Inpatient Hospital Stay: Payer: BC Managed Care – PPO

## 2023-08-22 DIAGNOSIS — Z808 Family history of malignant neoplasm of other organs or systems: Secondary | ICD-10-CM

## 2023-08-22 DIAGNOSIS — Z85850 Personal history of malignant neoplasm of thyroid: Secondary | ICD-10-CM

## 2023-08-22 NOTE — Progress Notes (Signed)
REFERRING PROVIDER: Sheliah Hatch, MD [address] [place] [address] [place],  [place] [address]  PRIMARY PROVIDER:  Sheliah Hatch, MD  PRIMARY REASON FOR VISIT:  1. Family history of melanoma   2. History of thyroid cancer      HISTORY OF PRESENT ILLNESS:   Jose Mills, a 52 y.o. male, was seen for a [facility] cancer genetics consultation at the request of Dr. Beverely Low due to a personal and family history of cancer.  Jose Mills presents to clinic today to discuss the possibility of a hereditary predisposition to cancer, genetic testing, and to further clarify his future cancer risks, as well as potential cancer risks for family members.   In 2015, at the age of 68, Jose Mills was diagnosed with papillary thyroid cancer. Treatment included a thyroidectomy. In May 2024, he was diagnosed with a basal cell carcinoma on his forehead.  This prompted other cancer screening including a colonoscopy that was reportedly normal, and prostate cancer screening with a PSA that was within normal limits.       CANCER HISTORY:  Oncology History   No history exists.    Past Medical History:  Diagnosis Date   Cancer (HCC)    Family history of melanoma    Headache(784.0)    HX MIGRAINES   History of thyroid cancer    PONV (postoperative nausea and vomiting)    Skin cancer    Thyroid ca (HCC) 2015    Past Surgical History:  Procedure Laterality Date   APPENDECTOMY     THYROID LOBECTOMY Right 01/17/2014   Procedure: TOTAL THYROIDECTOMY WITH LIMITED LYMPH NODE DISSECTION ;  Surgeon: Velora Heckler, MD;  Location: [facility];  Service: General;  Laterality: Right;   TYMPANOSTOMY TUBE PLACEMENT      Social History   Socioeconomic History   Marital status: Married    Spouse name: Not on file   Number of children: 2   Years of education: Not on file   Highest education level: Not on file  Occupational History   Occupation: VP Art gallery manager  Tobacco Use   Smoking status: Never   Smokeless  tobacco: Never  Vaping Use   Vaping status: Never Used  Substance and Sexual Activity   Alcohol use: Yes    Alcohol/week: 3.0 standard drinks of alcohol    Types: 3 Glasses of wine per week    Comment: .5 per day   Drug use: No   Sexual activity: Yes  Other Topics Concern   Not on file  Social History Narrative   Not on file   Social Determinants of Health   Financial Resource Strain: Not on file  Food Insecurity: Not on file  Transportation Needs: Not on file  Physical Activity: Not on file  Stress: Not on file  Social Connections: Not on file     FAMILY HISTORY:  We obtained a detailed, 4-generation family history.  Significant diagnoses are listed below: Family History  Problem Relation Age of Onset   Melanoma Paternal Aunt        dx. > 50   Dementia Maternal Grandfather      The patient has a son and daughter who are cancer free.  He has one sister who is cancer free.  Both parents are living.  The patient's mother is 20 and cancer free.  She has one sister who does not have a reported history of cancer.  The maternal grandparents are deceased.  The patient's father is 83  and cancer free.  He has three sisters and two brothers. One sister was recently diagnosed with melanoma.  The paternal grandparents died of non-cancer related issues.  Jose Mills is unaware of previous family history of genetic testing for hereditary cancer risks. Patient's maternal ancestors are of [place] descent, and paternal ancestors are of [place] descent. There is no reported Ashkenazi Jewish ancestry. There is no known consanguinity.  GENETIC COUNSELING ASSESSMENT: Jose Mills is a 52 y.o. male with a personal and family history of cancer which is not suggestive of a hereditary cancer syndrome at this time. We, therefore, discussed and recommended the following at today's visit.   DISCUSSION: We discussed that, in general, most cancer is not inherited in families, but instead is sporadic  or familial. Sporadic cancers occur by chance and typically happen at older ages (>50 years) as this type of cancer is caused by genetic changes acquired during an individual's lifetime. Some families have more cancers than would be expected by chance; however, the ages or types of cancer are not consistent with a known genetic mutation or known genetic mutations have been ruled out. This type of familial cancer is thought to be due to a combination of multiple genetic, environmental, hormonal, and lifestyle factors. While this combination of factors likely increases the risk of cancer, the exact source of this risk is not currently identifiable or testable.  We discussed that papillary thyroid cancer, the type of thyroid cancer he was diagnosed with, is not typically associated with hereditary cancer syndromes.  More rare types of thyroid cancer, such as Medullary or Follicular thyroid cancer can be associated with hereditary cancer syndromes, and may be a primary cancer in those conditions.  He has a family history of melanoma in one paternal aunt.  Melanoma can be part of a hereditary melanoma syndrome, as well as other cancer syndromes, but typically there needs to be 3 cases of melanoma in a family or a combination of melanoma and other cancers (including renal cell carcinoma or pancreatic cancer).   We reviewed the characteristics, features and inheritance patterns of hereditary cancer syndromes. We also discussed genetic testing, including the appropriate family members to test, the process of testing, insurance coverage and turn-around-time for results. We discussed the implications of a negative, positive, carrier and/or variant of uncertain significant result.   We discussed with Jose Mills that the personal and family history does not meet insurance or NCCN criteria for genetic testing and, therefore, is not highly consistent with a familial hereditary cancer syndrome.  We feel he is at low risk to  harbor a gene mutation associated with such a condition. If he wanted to pursue genetic testing the out of pocket cost of testing would be $250.    We discussed that some people do not want to undergo genetic testing due to fear of genetic discrimination.  The Genetic Information Nondiscrimination Act (GINA) was signed into federal law in 2008. GINA prohibits health insurers and most employers from discriminating against individuals based on genetic information (including the results of genetic tests and family history information). According to GINA, health insurance companies cannot consider genetic information to be a preexisting condition, nor can they use it to make decisions regarding coverage or rates. GINA also makes it illegal for most employers to use genetic information in making decisions about hiring, firing, promotion, or terms of employment. It is important to note that GINA does not offer protections for life insurance, disability insurance, or long-term care insurance.  GINA does not apply to those in the [employer], those who work for companies with less than 15 employees, and new life insurance or long-term disability insurance policies.  Health status due to a cancer diagnosis is not protected under GINA. More information about GINA can be found by visiting [employer].   PLAN: Jose Mills did not wish to pursue genetic testing at today's visit. We understand this decision and remain available to coordinate genetic testing at any time in the future. We, therefore, recommend Jose Mills continue to follow the cancer screening guidelines given by his primary healthcare provider.  Lastly, we encouraged Jose Mills to remain in contact with cancer genetics annually so that we can continuously update the family history and inform him of any changes in cancer genetics and testing that may be of benefit for this family.   Jose Mills questions were answered to his satisfaction today.  Our contact information was provided should additional questions or concerns arise. Thank you for the referral and allowing [place] to share in the care of your patient.   Landri Dorsainvil P. Lowell Guitar, MS, [facility] Licensed, Patent attorney [email or web address] phone: [phone]  The patient was seen for a total of 25 minutes in face-to-face genetic counseling.  The patient was seen alone.  Drs. Meliton Rattan, and/or [place] were available for questions, if needed..    _______________________________________________________________________ For Office Staff:  Number of people involved in session: 1 Was an Intern/ student involved with case: no

## 2023-09-05 DIAGNOSIS — D2362 Other benign neoplasm of skin of left upper limb, including shoulder: Secondary | ICD-10-CM | POA: Diagnosis not present

## 2023-09-05 DIAGNOSIS — D235 Other benign neoplasm of skin of trunk: Secondary | ICD-10-CM | POA: Diagnosis not present

## 2023-09-05 DIAGNOSIS — Z85828 Personal history of other malignant neoplasm of skin: Secondary | ICD-10-CM | POA: Diagnosis not present

## 2023-09-05 DIAGNOSIS — Z129 Encounter for screening for malignant neoplasm, site unspecified: Secondary | ICD-10-CM | POA: Diagnosis not present

## 2023-10-31 DIAGNOSIS — E890 Postprocedural hypothyroidism: Secondary | ICD-10-CM | POA: Diagnosis not present

## 2023-10-31 DIAGNOSIS — C73 Malignant neoplasm of thyroid gland: Secondary | ICD-10-CM | POA: Diagnosis not present

## 2024-03-21 DIAGNOSIS — D235 Other benign neoplasm of skin of trunk: Secondary | ICD-10-CM | POA: Diagnosis not present

## 2024-03-21 DIAGNOSIS — D2362 Other benign neoplasm of skin of left upper limb, including shoulder: Secondary | ICD-10-CM | POA: Diagnosis not present

## 2024-03-21 DIAGNOSIS — Z85828 Personal history of other malignant neoplasm of skin: Secondary | ICD-10-CM | POA: Diagnosis not present

## 2024-03-21 DIAGNOSIS — B356 Tinea cruris: Secondary | ICD-10-CM | POA: Diagnosis not present

## 2024-07-18 ENCOUNTER — Ambulatory Visit (INDEPENDENT_AMBULATORY_CARE_PROVIDER_SITE_OTHER): Payer: BC Managed Care – PPO | Admitting: Family Medicine

## 2024-07-18 ENCOUNTER — Encounter: Payer: Self-pay | Admitting: Family Medicine

## 2024-07-18 VITALS — BP 110/60 | HR 58 | Temp 98.1°F | Ht 76.0 in | Wt 222.0 lb

## 2024-07-18 DIAGNOSIS — E039 Hypothyroidism, unspecified: Secondary | ICD-10-CM | POA: Diagnosis not present

## 2024-07-18 DIAGNOSIS — Z125 Encounter for screening for malignant neoplasm of prostate: Secondary | ICD-10-CM

## 2024-07-18 DIAGNOSIS — Z114 Encounter for screening for human immunodeficiency virus [HIV]: Secondary | ICD-10-CM

## 2024-07-18 DIAGNOSIS — Z0000 Encounter for general adult medical examination without abnormal findings: Secondary | ICD-10-CM | POA: Diagnosis not present

## 2024-07-18 DIAGNOSIS — Z1159 Encounter for screening for other viral diseases: Secondary | ICD-10-CM | POA: Diagnosis not present

## 2024-07-18 LAB — CBC WITH DIFFERENTIAL/PLATELET
Basophils Absolute: 0 K/uL (ref 0.0–0.1)
Basophils Relative: 0.8 % (ref 0.0–3.0)
Eosinophils Absolute: 0.1 K/uL (ref 0.0–0.7)
Eosinophils Relative: 2.7 % (ref 0.0–5.0)
HCT: 48 % (ref 39.0–52.0)
Hemoglobin: 16.3 g/dL (ref 13.0–17.0)
Lymphocytes Relative: 21.9 % (ref 12.0–46.0)
Lymphs Abs: 1 K/uL (ref 0.7–4.0)
MCHC: 33.9 g/dL (ref 30.0–36.0)
MCV: 97.3 fl (ref 78.0–100.0)
Monocytes Absolute: 0.3 K/uL (ref 0.1–1.0)
Monocytes Relative: 6.6 % (ref 3.0–12.0)
Neutro Abs: 3.1 K/uL (ref 1.4–7.7)
Neutrophils Relative %: 68 % (ref 43.0–77.0)
Platelets: 181 K/uL (ref 150.0–400.0)
RBC: 4.94 Mil/uL (ref 4.22–5.81)
RDW: 12.8 % (ref 11.5–15.5)
WBC: 4.5 K/uL (ref 4.0–10.5)

## 2024-07-18 LAB — BASIC METABOLIC PANEL WITH GFR
BUN: 17 mg/dL (ref 6–23)
CO2: 31 meq/L (ref 19–32)
Calcium: 9.1 mg/dL (ref 8.4–10.5)
Chloride: 101 meq/L (ref 96–112)
Creatinine, Ser: 0.96 mg/dL (ref 0.40–1.50)
GFR: 92.04 mL/min (ref >60.00)
Glucose, Bld: 85 mg/dL (ref 70–99)
Potassium: 4.7 meq/L (ref 3.5–5.1)
Sodium: 141 meq/L (ref 135–145)

## 2024-07-18 LAB — LIPID PANEL
Cholesterol: 196 mg/dL (ref 0–200)
HDL: 68 mg/dL (ref >39.00)
LDL Cholesterol: 108 mg/dL — ABNORMAL HIGH (ref 0–99)
NonHDL: 127.83
Total CHOL/HDL Ratio: 3
Triglycerides: 98 mg/dL (ref 0.0–149.0)
VLDL: 19.6 mg/dL (ref 0.0–40.0)

## 2024-07-18 LAB — HEPATIC FUNCTION PANEL
ALT: 18 U/L (ref 0–53)
AST: 19 U/L (ref 0–37)
Albumin: 4.5 g/dL (ref 3.5–5.2)
Alkaline Phosphatase: 41 U/L (ref 39–117)
Bilirubin, Direct: 0.1 mg/dL (ref 0.0–0.3)
Total Bilirubin: 0.8 mg/dL (ref 0.2–1.2)
Total Protein: 7 g/dL (ref 6.0–8.3)

## 2024-07-18 LAB — PSA: PSA: 0.88 ng/mL (ref 0.10–4.00)

## 2024-07-18 LAB — TSH: TSH: 3.2 u[IU]/mL (ref 0.35–5.50)

## 2024-07-18 NOTE — Progress Notes (Signed)
   Subjective:    Patient ID: Jose Mills, male    DOB: [birth year], 53 y.o.   MRN: [id number]  HPI CPE- UTD on Tdap, colonoscopy  Patient Care Team    Relationship Specialty Notifications Start End  [name], MD PCP - General   11/18/10   Rytlewski, Selinda Redbird, MD Referring Physician Cardiology  11/26/15   Nichole Senior, MD Consulting Physician Endocrinology  11/28/15     Health Maintenance  Topic Date Due   HIV Screening  Never done   Hepatitis C Screening  Never done   Hepatitis B Vaccines (1 of 3 - 19+ 3-dose series) Never done   Zoster Vaccines- Shingrix (1 of 2) Never done   COVID-19 Vaccine (3 - Pfizer risk series) 04/10/2020   Pneumococcal Vaccine: 50+ Years (1 of 1 - PCV) Never done   INFLUENZA VACCINE  07/06/2024   DTaP/Tdap/Td (2 - Td or Tdap) 12/24/2026   Colonoscopy  04/27/2033   HPV VACCINES  Aged Out   Meningococcal B Vaccine  Aged Out      Review of Systems Patient reports no vision/hearing changes, anorexia, fever ,adenopathy, persistant/recurrent hoarseness, swallowing issues, chest pain, palpitations, edema, persistant/recurrent cough, hemoptysis, dyspnea (rest,exertional, paroxysmal nocturnal), gastrointestinal  bleeding (melena, rectal bleeding), abdominal pain, excessive heart burn, GU symptoms (dysuria, hematuria, voiding/incontinence issues) syncope, focal weakness, memory loss, numbness & tingling, skin/hair/nail changes, depression, anxiety, abnormal bruising/bleeding, musculoskeletal symptoms/signs.     Objective:   Physical Exam General Appearance:    Alert, cooperative, no distress, appears stated age  Head:    Normocephalic, without obvious abnormality, atraumatic  Eyes:    PERRL, conjunctiva/corneas clear, EOM's intact both eyes       Ears:    Normal TM's and external ear canals, both ears  Nose:   Nares normal, septum midline, mucosa normal, no drainage   or sinus tenderness  Throat:   Lips, mucosa, and tongue normal;  teeth and gums normal  Neck:   Supple, symmetrical, trachea midline, no adenopathy;       thyroid:  No enlargement/tenderness/nodules  Back:     Symmetric, no curvature, ROM normal, no CVA tenderness  Lungs:     Clear to auscultation bilaterally, respirations unlabored  Chest wall:    No tenderness or deformity  Heart:    Regular rate and rhythm, S1 and S2 normal, no murmur, rub   or gallop  Abdomen:     Soft, non-tender, bowel sounds active all four quadrants,    no masses, no organomegaly  Genitalia:    deferred  Rectal:    Extremities:   Extremities normal, atraumatic, no cyanosis or edema  Pulses:   2+ and symmetric all extremities  Skin:   Skin color, texture, turgor normal, no rashes or lesions, lipoma x2 on L forearm  Lymph nodes:   Cervical, supraclavicular, and axillary nodes normal  Neurologic:   CNII-XII intact. Normal strength, sensation and reflexes      throughout          Assessment & Plan:

## 2024-07-18 NOTE — Assessment & Plan Note (Signed)
 Pt's PE WNL w/ exception of known lipomas.  UTD on colonoscopy, Tdap.  Check labs.  Anticipatory guidance provided.

## 2024-07-18 NOTE — Patient Instructions (Signed)
 Follow up as needed or as scheduled We'll notify you of your lab results and make any changes if needed Keep up the good work on healthy diet and regular exercise- you look great! Get a counter force strap for your forearm ICE Ibuprofen for pain if needed Call with any questions or concerns Enjoy the rest of your summer!!!

## 2024-07-19 ENCOUNTER — Ambulatory Visit: Payer: Self-pay | Admitting: Family Medicine

## 2024-07-19 LAB — HIV ANTIBODY (ROUTINE TESTING W REFLEX): HIV 1&2 Ab, 4th Generation: NONREACTIVE

## 2024-07-19 LAB — HEPATITIS C ANTIBODY: Hepatitis C Ab: NONREACTIVE

## 2024-08-28 DIAGNOSIS — M25521 Pain in right elbow: Secondary | ICD-10-CM | POA: Diagnosis not present

## 2024-10-02 DIAGNOSIS — D485 Neoplasm of uncertain behavior of skin: Secondary | ICD-10-CM | POA: Diagnosis not present

## 2024-10-02 DIAGNOSIS — D044 Carcinoma in situ of skin of scalp and neck: Secondary | ICD-10-CM | POA: Diagnosis not present

## 2024-10-23 ENCOUNTER — Encounter: Payer: Self-pay | Admitting: Family Medicine

## 2024-10-23 DIAGNOSIS — J309 Allergic rhinitis, unspecified: Secondary | ICD-10-CM

## 2024-10-23 DIAGNOSIS — R0981 Nasal congestion: Secondary | ICD-10-CM

## 2024-10-23 NOTE — Telephone Encounter (Signed)
 Patient notes continued issues with allergies and sinus, requesting a referral to see a specialist, last OV was CPE 07/18/2024.  Would you like an updated office visit?

## 2024-11-06 NOTE — Telephone Encounter (Signed)
 Copied from CRM #[id number]. Topic: Referral - Question >> Nov 06, 2024  9:21 AM Aleatha BROCKS wrote: Reason for CRM: Patient has not heard from allergist yet and the patient feels like he doesn't need to go to the allergist actually anymore and believes he should go straight to the ENT doctor please contact patient to further discuss

## 2024-11-06 NOTE — Telephone Encounter (Signed)
 Patient would like referral to ENT instead of [facility]

## 2024-11-07 NOTE — Addendum Note (Signed)
 Addended by: Kian Gamarra E on: 11/07/2024 02:33 PM   Modules accepted: Orders

## 2024-11-08 ENCOUNTER — Encounter (INDEPENDENT_AMBULATORY_CARE_PROVIDER_SITE_OTHER): Payer: Self-pay

## 2024-11-20 ENCOUNTER — Ambulatory Visit: Payer: Self-pay | Admitting: Allergy

## 2024-12-03 NOTE — Progress Notes (Unsigned)
 "  New Patient Note  RE: Jose Mills MRN: [id number] DOB: 1971/11/12 Date of Office Visit: 12/04/2024  Consult requested by: [name], MD Primary care provider: [name], MD  Chief Complaint: No chief complaint on file.  History of Present Illness: I had the pleasure of seeing Jose Mills for initial evaluation at the [facility] on 12/04/2024. He is a 53 y.o. male, who is referred here by [name], MD for the evaluation of allergic rhinitis.  Discussed the use of AI scribe software for clinical note transcription with the patient, who gave verbal consent to proceed.  History of Present Illness             He reports symptoms of ***. Symptoms have been going on for *** years. The symptoms are present *** all year around with worsening in ***. Other triggers include exposure to ***. Anosmia: ***. Headache: ***. He has used *** with ***fair improvement in symptoms. Sinus infections: ***. Previous work up includes: ***. Previous ENT evaluation: ***. Previous sinus imaging: ***. History of nasal polyps: ***. Last eye exam: ***. History of reflux: ***.  Assessment and Plan: [name] is a 53 y.o. male with: ***  Assessment and Plan               No follow-ups on file.  No orders of the defined types were placed in this encounter.  Lab Orders  No laboratory test(s) ordered today    Other allergy  screening: Asthma: {Blank single:19197::yes,no} Rhino conjunctivitis: {Blank single:19197::yes,no} Food allergy : {Blank single:19197::yes,no} Medication allergy : {Blank single:19197::yes,no} Hymenoptera allergy : {Blank single:19197::yes,no} Urticaria: {Blank single:19197::yes,no} Eczema:{Blank single:19197::yes,no} History of recurrent infections suggestive of immunodeficency: {Blank single:19197::yes,no}  Diagnostics: Spirometry:  Tracings reviewed. His effort: {Blank  single:19197::Good reproducible efforts.,It was hard to get consistent efforts and there is a question as to whether this reflects a maximal maneuver.,Poor effort, data can not be interpreted.} FVC: ***L FEV1: ***L, ***% predicted FEV1/FVC ratio: ***% Interpretation: {Blank single:19197::Spirometry consistent with mild obstructive disease,Spirometry consistent with moderate obstructive disease,Spirometry consistent with severe obstructive disease,Spirometry consistent with possible restrictive disease,Spirometry consistent with mixed obstructive and restrictive disease,Spirometry uninterpretable due to technique,Spirometry consistent with normal pattern,No overt abnormalities noted given today's efforts}.  Please see scanned spirometry results for details.  Skin Testing: {Blank single:19197::Select foods,Environmental allergy  panel,Environmental allergy  panel and select foods,Food allergy  panel,None,Deferred due to recent antihistamines use}. *** Results discussed with patient/family.   Past Medical History: Patient Active Problem List   Diagnosis Date Noted   Family history of melanoma    History of thyroid  cancer    Hypothyroid 07/15/2023   Skin cancer, basal cell 04/17/2023   Paroxysmal atrial fibrillation (HCC) 05/30/2017   Physical exam 11/26/2015   Atrial flutter (HCC) 11/26/2015   Spermatocele 01/24/2015   Scrotal mass 07/29/2014   Allergy  to animal dander 01/12/2012   Lipoma 02/21/2009   Papillary thyroid  carcinoma, follicular variant, multifocal 02/21/2009   Past Medical History:  Diagnosis Date   Cancer (HCC)    Family history of melanoma    Headache(784.0)    HX MIGRAINES   History of thyroid  cancer    PONV (postoperative nausea and vomiting)    Skin cancer    Thyroid  ca (HCC) 2015   Past Surgical History: Past Surgical History:  Procedure Laterality Date   APPENDECTOMY     THYROID  LOBECTOMY Right 01/17/2014    Procedure: TOTAL THYROIDECTOMY WITH LIMITED LYMPH NODE DISSECTION ;  Surgeon: Krystal CHRISTELLA Spinner, MD;  Location: [facility];  Service: General;  Laterality: Right;   TYMPANOSTOMY TUBE PLACEMENT     Medication List:  Current Outpatient Medications  Medication Sig Dispense Refill   aspirin EC 81 MG tablet Take 81 mg by mouth as needed.     levothyroxine (SYNTHROID , LEVOTHROID) 112 MCG tablet 224mg  6 days a week, and then 112 the remaining day.     No current facility-administered medications for this visit.   Allergies: Allergies[1] Social History: Social History   Socioeconomic History   Marital status: Married    Spouse name: Not on file   Number of children: 2   Years of education: Not on file   Highest education level: Master's degree (e.g., MA, MS, MEng, MEd, MSW, MBA)  Occupational History   Occupation: VP art gallery manager  Tobacco Use   Smoking status: Never   Smokeless tobacco: Never  Vaping Use   Vaping status: Never Used  Substance and Sexual Activity   Alcohol use: Yes    Alcohol/week: 3.0 standard drinks of alcohol    Types: 3 Glasses of wine per week    Comment: .5 per day   Drug use: No   Sexual activity: Yes  Other Topics Concern   Not on file  Social History Narrative   Not on file   Social Drivers of Health   Tobacco Use: Low Risk (07/18/2024)   Patient History    Smoking Tobacco Use: Never    Smokeless Tobacco Use: Never    Passive Exposure: Not on file  Financial Resource Strain: Low Risk (07/17/2024)   Overall Financial Resource Strain (CARDIA)    Difficulty of Paying Living Expenses: Not hard at all  Food Insecurity: No Food Insecurity (07/17/2024)   Epic    Worried About Programme Researcher, Broadcasting/film/video in the Last Year: Never true    Ran Out of Food in the Last Year: Never true  Transportation Needs: No Transportation Needs (07/17/2024)   Epic    Lack of Transportation (Medical): No    Lack of Transportation (Non-Medical): No  Physical  Activity: Sufficiently Active (07/17/2024)   Exercise Vital Sign    Days of Exercise per Week: 7 days    Minutes of Exercise per Session: 60 min  Stress: No Stress Concern Present (07/17/2024)   [employer] of Occupational Health - Occupational Stress Questionnaire    Feeling of Stress: Only a little  Social Connections: Moderately Isolated (07/17/2024)   Social Connection and Isolation Panel    Frequency of Communication with Friends and Family: More than three times a week    Frequency of Social Gatherings with Friends and Family: More than three times a week    Attends Religious Services: Never    Database Administrator or Organizations: No    Attends Engineer, Structural: Not on file    Marital Status: Married  Depression (PHQ2-9): Low Risk (07/18/2024)   Depression (PHQ2-9)    PHQ-2 Score: 1  Alcohol Screen: Low Risk (07/17/2024)   Alcohol Screen    Last Alcohol Screening Score (AUDIT): 2  Housing: Unknown (07/17/2024)   Epic    Unable to Pay for Housing in the Last Year: No    Number of Times Moved in the Last Year: Not on file    Homeless in the Last Year: No  Utilities: Not on file  Health Literacy: Not on file   Lives in a ***. Smoking: *** Occupation: ***  Environmental History: Water Damage/mildew in the house: Network Engineer  in the family room: {Blank single:19197::yes,no} Carpet in the bedroom: {Blank single:19197::yes,no} Heating: {Blank single:19197::electric,gas,heat pump} Cooling: {Blank single:19197::central,window,heat pump} Pet: {Blank single:19197::yes ***,no}  Family History: Family History  Problem Relation Age of Onset   Melanoma Paternal Aunt        dx. > 50   Dementia Maternal Grandfather    Problem                               Relation Asthma                                   *** Eczema                                *** Food allergy                            *** Allergic rhino conjunctivitis     ***  Review of Systems  Constitutional:  Negative for appetite change, chills, fever and unexpected weight change.  HENT:  Negative for congestion and rhinorrhea.   Eyes:  Negative for itching.  Respiratory:  Negative for cough, chest tightness, shortness of breath and wheezing.   Cardiovascular:  Negative for chest pain.  Gastrointestinal:  Negative for abdominal pain.  Genitourinary:  Negative for difficulty urinating.  Skin:  Negative for rash.  Neurological:  Negative for headaches.    Objective: There were no vitals taken for this visit. There is no height or weight on file to calculate BMI. Physical Exam Vitals and nursing note reviewed.  Constitutional:      Appearance: Normal appearance. He is well-developed.  HENT:     Head: Normocephalic and atraumatic.     Right Ear: Tympanic membrane and external ear normal.     Left Ear: Tympanic membrane and external ear normal.     Nose: Nose normal.     Mouth/Throat:     Mouth: Mucous membranes are moist.     Pharynx: Oropharynx is clear.  Eyes:     Conjunctiva/sclera: Conjunctivae normal.  Cardiovascular:     Rate and Rhythm: Normal rate and regular rhythm.     Heart sounds: Normal heart sounds. No murmur heard.    No friction rub. No gallop.  Pulmonary:     Effort: Pulmonary effort is normal.     Breath sounds: Normal breath sounds. No wheezing, rhonchi or rales.  Musculoskeletal:     Cervical back: Neck supple.  Skin:    General: Skin is warm.     Findings: No rash.  Neurological:     Mental Status: He is alert and oriented to person, place, and time.  Psychiatric:        Behavior: Behavior normal.   The plan was reviewed with the patient/family, and all questions/concerned were addressed.  It was my pleasure to see Grafton today and participate in his care. Please feel free to contact me with any questions or concerns.  Sincerely,  Orlan Cramp, DO Allergy  &  Immunology  [facility]  [facility] office: [phone] [facility] office: [phone]     [1] No Known Allergies "

## 2024-12-04 ENCOUNTER — Other Ambulatory Visit: Payer: Self-pay

## 2024-12-04 ENCOUNTER — Ambulatory Visit (INDEPENDENT_AMBULATORY_CARE_PROVIDER_SITE_OTHER): Payer: Self-pay | Admitting: Allergy

## 2024-12-04 ENCOUNTER — Encounter: Payer: Self-pay | Admitting: Allergy

## 2024-12-04 VITALS — BP 122/76 | HR 62 | Temp 98.4°F | Resp 18 | Ht 76.0 in | Wt 225.1 lb

## 2024-12-04 DIAGNOSIS — J328 Other chronic sinusitis: Secondary | ICD-10-CM | POA: Diagnosis not present

## 2024-12-04 NOTE — Patient Instructions (Addendum)
 Rhinitis  Return for allergy  skin testing. Will make additional recommendations based on results. Make sure you don't take any antihistamines for 3 days before the skin testing appointment. Don't put any lotion on the back and arms on the day of testing.  Must be in good health and not ill. No vaccines/injections/antibiotics within the past 7 days.  Plan on being here for 30-60 minutes.  Use Flonase (fluticasone) nasal spray 1-2 sprays per nostril once a day as needed for nasal congestion.  Nasal saline spray (i.e., Simply Saline) or nasal saline lavage (i.e., NeilMed) is recommended as needed and prior to medicated nasal sprays.  Keep ENT appointment to look at sinus anatomy.   Return for Skin testing depending on ENT evaluation.

## 2024-12-18 ENCOUNTER — Institutional Professional Consult (permissible substitution) (INDEPENDENT_AMBULATORY_CARE_PROVIDER_SITE_OTHER): Admitting: Otolaryngology

## 2024-12-25 ENCOUNTER — Encounter (INDEPENDENT_AMBULATORY_CARE_PROVIDER_SITE_OTHER): Payer: Self-pay | Admitting: Otolaryngology

## 2024-12-25 ENCOUNTER — Ambulatory Visit (INDEPENDENT_AMBULATORY_CARE_PROVIDER_SITE_OTHER): Admitting: Otolaryngology

## 2024-12-25 VITALS — BP 119/72 | HR 33 | Ht 76.0 in | Wt 225.0 lb

## 2024-12-25 DIAGNOSIS — R519 Headache, unspecified: Secondary | ICD-10-CM

## 2024-12-25 DIAGNOSIS — G4489 Other headache syndrome: Secondary | ICD-10-CM

## 2024-12-25 NOTE — Progress Notes (Signed)
 Reason for Consult: Headaches Referring Physician: Dr. Mahlon Mills Jose Mills is an 54 y.o. male.  HPI: History of pain in his face and then it results in a headache on the same side.  This occurs 3 times a week.  He has been having this problem for over a year.  The pain starts in the nasolabial fold area maxillary region and then a little bit later he gets a pretty significant same side headache.  These last for a few hours.  He has tried [employer] and was concerned this is a sinus problem.  He has not been on any antibiotics and over 6 months.  He does have a family history of migraines.  No purulent drainage from his nose.  Past Medical History:  Diagnosis Date   Cancer (HCC)    Family history of melanoma    Headache(784.0)    HX MIGRAINES   History of thyroid  cancer    PONV (postoperative nausea and vomiting)    Skin cancer    Thyroid  ca [facility]) 2015    Past Surgical History:  Procedure Laterality Date   APPENDECTOMY     THYROID  LOBECTOMY Right 01/17/2014   Procedure: TOTAL THYROIDECTOMY WITH LIMITED LYMPH NODE DISSECTION ;  Surgeon: Jose CHRISTELLA Spinner, MD;  Location: [facility];  Service: General;  Laterality: Right;   TYMPANOSTOMY TUBE PLACEMENT      Family History  Problem Relation Age of Onset   Melanoma Paternal Aunt        dx. > 50   Dementia Maternal Grandfather     Social History:  reports that he has never smoked. He has never used smokeless tobacco. He reports current alcohol use of about 3.0 standard drinks of alcohol per week. He reports that he does not use drugs.  Allergies: Allergies[1]   No results found for this or any previous visit (from the past 48 hours).  No results found.  ROS Blood pressure 119/72, pulse (!) 33, height 6' 4 (1.93 m), weight 225 lb (102.1 kg), SpO2 92%. Physical Exam Constitutional:      Appearance: Normal appearance.  HENT:     Head: Normocephalic and atraumatic.     Right Ear: Tympanic membrane is without lesions and middle  ear aerated, ear canal and external ear normal.     Left Ear: Tympanic membrane is without lesions and middle ear aerated, ear canal and external ear normal.     Nose: Nose without deviation of septum.  Turbinates with mild hypertrophy, No significant swelling or masses.     Oral cavity/oropharynx: Mucous membranes are moist. No lesions or masses    Larynx: normal voice. Mirror attempted without success    Eyes:     Extraocular Movements: Extraocular movements intact.     Conjunctiva/sclera: Conjunctivae normal.     Pupils: Pupils are equal, round, and reactive to light.  Cardiovascular:     Rate and Rhythm: Normal rate.  Pulmonary:     Effort: Pulmonary effort is normal.  Musculoskeletal:     Cervical back: Normal range of motion and neck supple. No rigidity.  Lymphadenopathy:     Cervical: No cervical adenopathy or masses.salivary glands without lesions. .     Salivary glands- no mass or swelling Neurological:     Mental Status: He is alert. CN 2-12 intact. No nystagmus      Assessment/Plan: Headaches-this seems like he more has a cluster headache or migraine problem.  He has one-sided mid facial pain and then a headache  on the same side.  I doubt he has sinusitis but it is worth ruling out.  He states that at 1 point many years ago a dentist told him his sinuses were not clear.  This was about 15 years ago.  I think an MRI scan is the most efficient way to rule out any issues with his brain and then also identify any sinus problems.  Jose Mills 12/25/2024, 4:39 PM        [1] No Known Allergies

## 2025-01-04 ENCOUNTER — Encounter (INDEPENDENT_AMBULATORY_CARE_PROVIDER_SITE_OTHER): Payer: Self-pay | Admitting: Otolaryngology

## 2025-01-08 ENCOUNTER — Ambulatory Visit
Admission: RE | Admit: 2025-01-08 | Discharge: 2025-01-08 | Disposition: A | Source: Ambulatory Visit | Attending: Otolaryngology | Admitting: Otolaryngology

## 2025-01-08 DIAGNOSIS — G4489 Other headache syndrome: Secondary | ICD-10-CM

## 2025-01-08 MED ORDER — GADOPICLENOL 0.5 MMOL/ML IV SOLN
10.0000 mL | Freq: Once | INTRAVENOUS | Status: AC | PRN
  Administered 2025-01-08: 10 mL via INTRAVENOUS

## 2025-01-11 ENCOUNTER — Telehealth (INDEPENDENT_AMBULATORY_CARE_PROVIDER_SITE_OTHER): Payer: Self-pay | Admitting: Otolaryngology

## 2025-01-11 NOTE — Telephone Encounter (Signed)
 Called patient and left a voicemail stating that we had his MRI results and asked him to give me a call back.

## 2025-01-11 NOTE — Telephone Encounter (Signed)
 I left a message with him to call about the MRI scan results which show no abnormality and no sinusitis.  At this point with the description of his symptoms with headaches and facial pain he needs to see a neurologist.

## 2025-07-19 ENCOUNTER — Encounter: Admitting: Family Medicine
